# Patient Record
Sex: Female | Born: 1970 | Race: White | Hispanic: No | Marital: Married | Smoking: Never smoker
Health system: Southern US, Community
[De-identification: ages and names within clinical notes are randomized; demographics above are authoritative.]

## PROBLEM LIST (undated history)

## (undated) DIAGNOSIS — I10 Essential (primary) hypertension: Secondary | ICD-10-CM

## (undated) DIAGNOSIS — E785 Hyperlipidemia, unspecified: Secondary | ICD-10-CM

## (undated) HISTORY — DX: Hyperlipidemia, unspecified: E78.5

## (undated) HISTORY — DX: Essential (primary) hypertension: I10

---

## 2011-03-19 ENCOUNTER — Ambulatory Visit: Payer: Self-pay | Admitting: Obstetrics & Gynecology

## 2011-03-20 ENCOUNTER — Inpatient Hospital Stay: Payer: Self-pay | Admitting: Obstetrics & Gynecology

## 2012-08-05 ENCOUNTER — Ambulatory Visit: Payer: Self-pay | Admitting: Obstetrics & Gynecology

## 2015-12-14 ENCOUNTER — Other Ambulatory Visit: Payer: Self-pay | Admitting: Obstetrics & Gynecology

## 2017-01-29 ENCOUNTER — Other Ambulatory Visit: Payer: Self-pay | Admitting: Obstetrics & Gynecology

## 2017-03-04 ENCOUNTER — Ambulatory Visit: Payer: Self-pay | Admitting: Obstetrics & Gynecology

## 2017-03-15 ENCOUNTER — Other Ambulatory Visit: Payer: Self-pay | Admitting: Obstetrics & Gynecology

## 2017-03-21 ENCOUNTER — Telehealth: Payer: Self-pay

## 2017-03-21 MED ORDER — LEVONORG-ETH ESTRAD TRIPHASIC PO TABS: 1.0000 | ORAL_TABLET | Freq: Every day | ORAL | 0 refills | Status: DC

## 2017-03-21 NOTE — Telephone Encounter (Signed)
Pt has appt 9/10, needs refill of enpresse until then.  Refills eRx'd.  Pt's mailbox is full.

## 2017-04-01 ENCOUNTER — Ambulatory Visit (INDEPENDENT_AMBULATORY_CARE_PROVIDER_SITE_OTHER): Payer: BC Managed Care – PPO | Admitting: Obstetrics & Gynecology

## 2017-04-01 ENCOUNTER — Encounter: Payer: Self-pay | Admitting: Obstetrics & Gynecology

## 2017-04-01 VITALS — BP 130/90 | HR 76 | Ht 62.0 in | Wt 181.0 lb

## 2017-04-01 DIAGNOSIS — Z1231 Encounter for screening mammogram for malignant neoplasm of breast: Secondary | ICD-10-CM

## 2017-04-01 DIAGNOSIS — Z0000 Encounter for general adult medical examination without abnormal findings: Secondary | ICD-10-CM

## 2017-04-01 DIAGNOSIS — Z01419 Encounter for gynecological examination (general) (routine) without abnormal findings: Secondary | ICD-10-CM

## 2017-04-01 DIAGNOSIS — Z1239 Encounter for other screening for malignant neoplasm of breast: Secondary | ICD-10-CM

## 2017-04-01 MED ORDER — LEVONORG-ETH ESTRAD TRIPHASIC PO TABS: 1.0000 | ORAL_TABLET | Freq: Every day | ORAL | 11 refills | Status: DC

## 2017-04-01 MED ORDER — FLUOXETINE HCL 40 MG PO CAPS: 40.0000 mg | ORAL_CAPSULE | Freq: Every day | ORAL | 11 refills | Status: DC

## 2017-04-01 NOTE — Patient Instructions (Signed)
PAP every three years Mammogram every year    Call [phone] to schedule at [facility] Colonoscopy every 10 years after age 46 Labs yearly (with PCP) 

## 2017-04-01 NOTE — Progress Notes (Signed)
HPI:      [name] is a 46 y.o. [id number] who LMP was Patient's last menstrual period was 03/13/2017., she presents today for her annual examination. The patient has no complaints today. The patient is sexually active. Her last pap: approximate date 2017 and was normal and last mammogram: approximate date 2016 and was normal. The patient does perform self breast exams.  There is no notable family history of breast or ovarian cancer in her family.  The patient has regular exercise: yes.  The patient denies current symptoms of depression.    GYN History: Contraception: OCP (estrogen/progesterone)  PMHx: History reviewed. No pertinent past medical history. History reviewed. No pertinent surgical history. Family History  Problem Relation Age of Onset  . Diabetes Maternal Uncle   . Colon cancer Maternal Grandfather    Social History  Substance Use Topics  . Smoking status: Never Smoker  . Smokeless tobacco: Never Used  . Alcohol use No    Current Outpatient Prescriptions:  .  FLUoxetine (PROZAC) 40 MG capsule, Take 1 capsule (40 mg total) by mouth daily., Disp: 30 capsule, Rfl: 11 .  levonorgestrel-ethinyl estradiol (ENPRESSE,TRIVORA) tablet, Take 1 tablet by mouth daily., Disp: 1 Package, Rfl: 11 Allergies: Patient has no known allergies.  Review of Systems  Constitutional: Negative for chills, fever and malaise/fatigue.  HENT: Negative for congestion, sinus pain and sore throat.   Eyes: Negative for blurred vision and pain.  Respiratory: Negative for cough and wheezing.   Cardiovascular: Negative for chest pain and leg swelling.  Gastrointestinal: Negative for abdominal pain, constipation, diarrhea, heartburn, nausea and vomiting.  Genitourinary: Negative for dysuria, frequency, hematuria and urgency.  Musculoskeletal: Negative for back pain, joint pain, myalgias and neck pain.  Skin: Negative for itching and rash.  Neurological: Negative for dizziness, tremors and  weakness.  Endo/Heme/Allergies: Does not bruise/bleed easily.  Psychiatric/Behavioral: Negative for depression. The patient is not nervous/anxious and does not have insomnia.     Objective: BP 130/90   Pulse 76   Ht 5\' 2"  (1.575 m)   Wt 181 lb (82.1 kg)   LMP 03/13/2017   BMI 33.11 kg/m   Filed Weights   04/01/17 1545  Weight: 181 lb (82.1 kg)   Body mass index is 33.11 kg/m. Physical Exam  Constitutional: She is oriented to person, place, and time. She appears well-developed and well-nourished. No distress.  Genitourinary: Rectum normal, vagina normal and uterus normal. Pelvic exam was performed with patient supine. There is no rash or lesion on the right labia. There is no rash or lesion on the left labia. Vagina exhibits no lesion. No bleeding in the vagina. Right adnexum does not display mass and does not display tenderness. Left adnexum does not display mass and does not display tenderness. Cervix does not exhibit motion tenderness, lesion, friability or polyp.   Uterus is mobile and midaxial. Uterus is not enlarged or exhibiting a mass.  HENT:  Head: Normocephalic and atraumatic. Head is without laceration.  Right Ear: Hearing normal.  Left Ear: Hearing normal.  Nose: No epistaxis.  No foreign bodies.  Mouth/Throat: Uvula is midline, oropharynx is clear and moist and mucous membranes are normal.  Eyes: Pupils are equal, round, and reactive to light.  Neck: Normal range of motion. Neck supple. No thyromegaly present.  Cardiovascular: Normal rate and regular rhythm.  Exam reveals no gallop and no friction rub.   No murmur heard. Pulmonary/Chest: Effort normal and breath sounds normal. No respiratory distress. She  has no wheezes. Right breast exhibits no mass, no skin change and no tenderness. Left breast exhibits no mass, no skin change and no tenderness.  Abdominal: Soft. Bowel sounds are normal. She exhibits no distension. There is no tenderness. There is no rebound.    Musculoskeletal: Normal range of motion.  Neurological: She is alert and oriented to person, place, and time. No cranial nerve deficit.  Skin: Skin is warm and dry.  Psychiatric: She has a normal mood and affect. Judgment normal.  Vitals reviewed.   Assessment:  ANNUAL EXAM 1. Annual physical exam   2. Screening for breast cancer      Screening Plan:            1.  Cervical Screening-  Pap smear schedule reviewed with patient  2. Breast screening- Exam annually and mammogram>40 planned   3. Colonoscopy every 10 years, Hemoccult testing - after age 53  4. Labs UTD and were done here last year- normal  5. Counseling for contraception: oral contraceptives (estrogen/progesterone)  Other:  1. Annual physical exam  2. Screening for breast cancer - MM DIGITAL SCREENING BILATERAL; Future      F/U  Return in about 1 year (around 04/01/2018) for Annual.  Barnett Applebaum, MD, [facility], [facility] 04/01/2017  4:19 PM

## 2018-03-20 ENCOUNTER — Other Ambulatory Visit: Payer: Self-pay | Admitting: Obstetrics & Gynecology

## 2018-03-21 NOTE — Telephone Encounter (Signed)
Sch annual.  Rx renewed til then.

## 2018-03-21 NOTE — Telephone Encounter (Signed)
Patient is schedule 04/09/18 with [facility]

## 2018-04-09 ENCOUNTER — Ambulatory Visit: Payer: BC Managed Care – PPO | Admitting: Obstetrics & Gynecology

## 2018-04-10 ENCOUNTER — Ambulatory Visit: Payer: BC Managed Care – PPO | Admitting: Obstetrics & Gynecology

## 2018-04-19 ENCOUNTER — Other Ambulatory Visit: Payer: Self-pay | Admitting: Obstetrics & Gynecology

## 2018-04-24 ENCOUNTER — Ambulatory Visit (INDEPENDENT_AMBULATORY_CARE_PROVIDER_SITE_OTHER): Payer: BC Managed Care – PPO | Admitting: Obstetrics & Gynecology

## 2018-04-24 ENCOUNTER — Other Ambulatory Visit: Payer: Self-pay | Admitting: Obstetrics & Gynecology

## 2018-04-24 ENCOUNTER — Other Ambulatory Visit (HOSPITAL_COMMUNITY)
Admission: RE | Admit: 2018-04-24 | Discharge: 2018-04-24 | Disposition: A | Discharge status: Home / Self Care | Payer: BC Managed Care – PPO | Source: Ambulatory Visit | Attending: Obstetrics & Gynecology | Admitting: Obstetrics & Gynecology

## 2018-04-24 ENCOUNTER — Encounter: Payer: Self-pay | Admitting: Obstetrics & Gynecology

## 2018-04-24 VITALS — BP 140/80 | Ht 62.0 in | Wt 165.0 lb

## 2018-04-24 DIAGNOSIS — Z13220 Encounter for screening for lipoid disorders: Secondary | ICD-10-CM

## 2018-04-24 DIAGNOSIS — Z1239 Encounter for other screening for malignant neoplasm of breast: Secondary | ICD-10-CM | POA: Diagnosis not present

## 2018-04-24 DIAGNOSIS — Z1329 Encounter for screening for other suspected endocrine disorder: Secondary | ICD-10-CM | POA: Diagnosis not present

## 2018-04-24 DIAGNOSIS — Z01419 Encounter for gynecological examination (general) (routine) without abnormal findings: Secondary | ICD-10-CM

## 2018-04-24 DIAGNOSIS — Z124 Encounter for screening for malignant neoplasm of cervix: Secondary | ICD-10-CM

## 2018-04-24 DIAGNOSIS — Z0000 Encounter for general adult medical examination without abnormal findings: Secondary | ICD-10-CM

## 2018-04-24 DIAGNOSIS — Z131 Encounter for screening for diabetes mellitus: Secondary | ICD-10-CM

## 2018-04-24 DIAGNOSIS — Z1321 Encounter for screening for nutritional disorder: Secondary | ICD-10-CM

## 2018-04-24 MED ORDER — LEVONORG-ETH ESTRAD TRIPHASIC PO TABS: 1.0000 | ORAL_TABLET | Freq: Every day | ORAL | 3 refills | Status: DC

## 2018-04-24 MED ORDER — FLUOXETINE HCL 40 MG PO CAPS: 40.0000 mg | ORAL_CAPSULE | Freq: Every day | ORAL | 3 refills | Status: DC

## 2018-04-24 NOTE — Progress Notes (Signed)
HPI:      [name] is a 47 y.o. [id number] who LMP was Patient's last menstrual period was 04/09/2018., she presents today for her annual examination. The patient has no complaints today. The patient is sexually active. Her last pap: approximate date 2017 and was normal and last mammogram: approximate date 2014 and was normal. The patient does perform self breast exams.  There is no notable family history of breast or ovarian cancer in her family.  The patient has regular exercise: yes.  The patient denies current symptoms of depression.    GYN History: Contraception: OCP (estrogen/progesterone)  PMHx: History reviewed. No pertinent past medical history. History reviewed. No pertinent surgical history. Family History  Problem Relation Age of Onset  . Diabetes Maternal Uncle   . Colon cancer Maternal Grandfather   . Cancer Paternal Grandfather    Social History   Tobacco Use  . Smoking status: Never Smoker  . Smokeless tobacco: Never Used  Substance Use Topics  . Alcohol use: No  . Drug use: No    Current Outpatient Medications:  .  FLUoxetine (PROZAC) 40 MG capsule, Take 1 capsule (40 mg total) by mouth daily., Disp: 90 capsule, Rfl: 3 .  levonorgestrel-ethinyl estradiol (ENPRESSE-28) tablet, Take 1 tablet by mouth daily., Disp: 84 tablet, Rfl: 3 Allergies: Patient has no known allergies.  Review of Systems  Constitutional: Negative for chills, fever and malaise/fatigue.  HENT: Negative for congestion, sinus pain and sore throat.   Eyes: Negative for blurred vision and pain.  Respiratory: Negative for cough and wheezing.   Cardiovascular: Negative for chest pain and leg swelling.  Gastrointestinal: Negative for abdominal pain, constipation, diarrhea, heartburn, nausea and vomiting.  Genitourinary: Negative for dysuria, frequency, hematuria and urgency.  Musculoskeletal: Negative for back pain, joint pain, myalgias and neck pain.  Skin: Negative for itching and  rash.  Neurological: Negative for dizziness, tremors and weakness.  Endo/Heme/Allergies: Does not bruise/bleed easily.  Psychiatric/Behavioral: Negative for depression. The patient is not nervous/anxious and does not have insomnia.    Objective: BP 140/80   Ht 5\' 2"  (1.575 m)   Wt 165 lb (74.8 kg)   LMP 04/09/2018   BMI 30.18 kg/m   Filed Weights   04/24/18 1537  Weight: 165 lb (74.8 kg)   Body mass index is 30.18 kg/m. Physical Exam  Constitutional: She is oriented to person, place, and time. She appears well-developed and well-nourished. No distress.  Genitourinary: Rectum normal, vagina normal and uterus normal. Pelvic exam was performed with patient supine. There is no rash or lesion on the right labia. There is no rash or lesion on the left labia. Vagina exhibits no lesion. No bleeding in the vagina. Right adnexum does not display mass and does not display tenderness. Left adnexum does not display mass and does not display tenderness. Cervix does not exhibit motion tenderness, lesion, friability or polyp.   Uterus is mobile and midaxial. Uterus is not enlarged or exhibiting a mass.  HENT:  Head: Normocephalic and atraumatic. Head is without laceration.  Right Ear: Hearing normal.  Left Ear: Hearing normal.  Nose: No epistaxis.  No foreign bodies.  Mouth/Throat: Uvula is midline, oropharynx is clear and moist and mucous membranes are normal.  Eyes: Pupils are equal, round, and reactive to light.  Neck: Normal range of motion. Neck supple. No thyromegaly present.  Cardiovascular: Normal rate and regular rhythm. Exam reveals no gallop and no friction rub.  No murmur heard. Pulmonary/Chest: Effort normal  and breath sounds normal. No respiratory distress. She has no wheezes. Right breast exhibits no mass, no skin change and no tenderness. Left breast exhibits no mass, no skin change and no tenderness.  Abdominal: Soft. Bowel sounds are normal. She exhibits no distension. There is no  tenderness. There is no rebound.  Musculoskeletal: Normal range of motion.  Neurological: She is alert and oriented to person, place, and time. No cranial nerve deficit.  Skin: Skin is warm and dry.  Psychiatric: She has a normal mood and affect. Judgment normal.  Vitals reviewed.  Assessment:  ANNUAL EXAM 1. Annual physical exam   2. Screening for breast cancer   3. Screening for cervical cancer    Screening Plan:            1.  Cervical Screening-  Pap smear done today  2. Breast screening- Exam annually and mammogram>40 planned   3. Colonoscopy every 10 years, Hemoccult testing - after age 87  4. Labs To return fasting at a later date  5. Counseling for contraception: oral contraceptives (estrogen/progesterone)   6. Refill Prozac; doing well, no SE    F/U  Return in about 1 year (around 04/25/2019) for Annual.  Barnett Applebaum, MD, [facility], [facility] 04/24/2018  4:13 PM

## 2018-04-24 NOTE — Patient Instructions (Addendum)
PAP every three years Mammogram every year    Call [phone] to schedule at [facility] Colonoscopy every 10 years after age 47 Labs yearly

## 2018-04-28 LAB — CYTOLOGY - PAP
DIAGNOSIS: NEGATIVE
HPV: NOT DETECTED

## 2018-08-05 ENCOUNTER — Telehealth: Payer: Self-pay

## 2018-08-05 NOTE — Telephone Encounter (Signed)
Pt aware to schedule mammo.

## 2018-08-05 NOTE — Telephone Encounter (Signed)
-----   Message from Gae Dry, MD sent at 07/30/2018  7:57 AM EST ----- Regarding: MMG Received notice she has not received MMG yet as ordered at her Annual. Please check and encourage her to do this, and document conversation.

## 2018-12-12 ENCOUNTER — Telehealth: Payer: Self-pay

## 2018-12-12 NOTE — Telephone Encounter (Signed)
Pt calling for elevated BP and H/As.  BP has been running around 178/85 and this am it was 184/80.  Does she need to get started on medication?  What to do?  [phone]  Pt aware PH not in office today.  Adv to go to an urgent care.  Pt states she has a primary care.  She doesn't see PCP much she just sees PH.  Adv we are not a PCP.  BP is out of our scope of practice unless pregnant.  Pt states will contact PCP and see if they can see her.  Adv if they couldn't to go to an urgent care.

## 2019-02-28 ENCOUNTER — Other Ambulatory Visit: Payer: Self-pay | Admitting: Obstetrics & Gynecology

## 2019-03-17 ENCOUNTER — Other Ambulatory Visit: Payer: Self-pay

## 2019-03-17 ENCOUNTER — Telehealth: Payer: Self-pay

## 2019-03-17 NOTE — Telephone Encounter (Signed)
Pt called asking for [facility] to refill her blood pressure medication she received at the ER because she does not have a PCP. I have advised pt that she will need to have this monitored by a PCP but I will send this message to [facility] to see if he will refill in the mean time. Please advise, Thank you

## 2019-03-18 NOTE — Telephone Encounter (Signed)
Patient is schedule Monday, 9/31/20 with [place]

## 2019-03-18 NOTE — Telephone Encounter (Signed)
Can you schedule appt with [facility]

## 2019-03-18 NOTE — Telephone Encounter (Signed)
Pt does not have MyChart to message her.  I would need to see her as well as information/copies from her ER visit.  As she is on OCPs, this is a concern that she also may have HTN. If she needs help, we can assist with finding a PCP as well.  In the mean time, I should probably see her soon to recheck BP and discuss.

## 2019-03-23 ENCOUNTER — Ambulatory Visit (INDEPENDENT_AMBULATORY_CARE_PROVIDER_SITE_OTHER): Payer: BC Managed Care – PPO | Admitting: Obstetrics & Gynecology

## 2019-03-23 ENCOUNTER — Encounter: Payer: Self-pay | Admitting: Obstetrics & Gynecology

## 2019-03-23 ENCOUNTER — Other Ambulatory Visit: Payer: Self-pay

## 2019-03-23 VITALS — BP 160/90 | Ht 62.0 in | Wt 172.0 lb

## 2019-03-23 DIAGNOSIS — I10 Essential (primary) hypertension: Secondary | ICD-10-CM | POA: Insufficient documentation

## 2019-03-23 DIAGNOSIS — Z3041 Encounter for surveillance of contraceptive pills: Secondary | ICD-10-CM

## 2019-03-23 MED ORDER — AMLODIPINE BESYLATE 5 MG PO TABS: 5.0000 mg | ORAL_TABLET | Freq: Every day | ORAL | 1 refills | Status: DC

## 2019-03-23 MED ORDER — NORETHINDRONE 0.35 MG PO TABS: 1.0000 | ORAL_TABLET | Freq: Every day | ORAL | 3 refills | Status: DC

## 2019-03-23 NOTE — Progress Notes (Signed)
Obstetrics & Gynecology Office Visit   Chief Complaint  Patient presents with  . Blood Pressure Check    History of Present Illness: 48 y.o. [id number] being seen for follow up blood pressure check today.  The patient is premenopausal.  The established diagnosis for the patient is chronic hypertension.  She is currently on Amlodipine, but has been off for the last 5 dyas due to Rx running out.  SHe was started on this medicine in May when she was dx at urgent care center w HTN, noting sx's of headaches and heart palpitations prior to this dx.  She has not had these sx's any more, and not recently either off of the medicine..  She reports no current symptoms attributable to her blood pressure.  Medication list reviewed medications which may contribute to BP elevation were not noted.  Past Medical History:  History reviewed. No pertinent past medical history.  Past Surgical History:  History reviewed. No pertinent surgical history.  Gynecologic History: No LMP recorded.  Obstetric History: [id number]  Family History:  Family History  Problem Relation Age of Onset  . Diabetes Maternal Uncle   . Colon cancer Maternal Grandfather   . Cancer Paternal Grandfather     Social History:  Social History   Socioeconomic History  . Marital status: Married    Spouse name: Not on file  . Number of children: Not on file  . Years of education: Not on file  . Highest education level: Not on file  Occupational History  . Not on file  Social Needs  . Financial resource strain: Not on file  . Food insecurity    Worry: Not on file    Inability: Not on file  . Transportation needs    Medical: Not on file    Non-medical: Not on file  Tobacco Use  . Smoking status: Never Smoker  . Smokeless tobacco: Never Used  Substance and Sexual Activity  . Alcohol use: No  . Drug use: No  . Sexual activity: Yes    Birth control/protection: Pill  Lifestyle  . Physical activity    Days per week: Not on  file    Minutes per session: Not on file  . Stress: Not on file  Relationships  . Social Herbalist on phone: Not on file    Gets together: Not on file    Attends religious service: Not on file    Active member of club or organization: Not on file    Attends meetings of clubs or organizations: Not on file    Relationship status: Not on file  . Intimate partner violence    Fear of current or ex partner: Not on file    Emotionally abused: Not on file    Physically abused: Not on file    Forced sexual activity: Not on file  Other Topics Concern  . Not on file  Social History Narrative  . Not on file    Allergies:  No Known Allergies  Medications: Prior to Admission medications   Medication Sig Start Date End Date Taking? Authorizing Provider  albuterol (VENTOLIN HFA) 108 (90 Base) MCG/ACT inhaler Inhale into the lungs. 06/13/15  Yes [provider]  FLUoxetine (PROZAC) 40 MG capsule Take 1 capsule (40 mg total) by mouth daily. 04/24/18  Yes Gae Dry, MD  amLODipine (NORVASC) 5 MG tablet Take 1 tablet (5 mg total) by mouth daily. 03/23/19   Gae Dry, MD  fluticasone Asencion Islam)  50 MCG/ACT nasal spray Place into the nose. 08/23/16   [provider]  norethindrone (MICRONOR) 0.35 MG tablet Take 1 tablet (0.35 mg total) by mouth daily. 03/23/19   Gae Dry, MD    ROS  Physical Exam Blood pressure (!) 160/90, height 5\' 2"  (1.575 m), weight 172 lb (78 kg).  No LMP recorded.  General: NAD HEENT: normocephalic, anicteric Pulmonary: No increased work of breathing Cardiovascular: RRR, distal pulses 2+ Extremities: noedema, no erythema, no tenderness Neurologic: Grossly intact Psychiatric: mood appropriate, affect full  Assessment: 48 y.o. [id number] presenting for blood pressure evaluation today  Plan: Problem List Items Addressed This Visit      Cardiovascular and Mediastinum   Essential hypertension - Primary   Relevant  Medications   amLODipine (NORVASC) 5 MG tablet    Other Visit Diagnoses    Encounter for surveillance of contraceptive pills       Relevant Medications   norethindrone (MICRONOR) 0.35 MG tablet      1) Blood pressure - blood pressure at today's visit is elevated.  As a result "antihypertensive therapy will be initiated. - Restart Amlodipine 5 mg - Reassess BP this week; will call results to me - additional blood work was not obtained   2) Plan to change OCP to Progesterone Only Pill to lessen risk associated w HTN.  List of PCP in area given, and she is to schedule appt w PCP to further manage HTN long term.  Barnett Applebaum, MD, [facility], [facility] 03/23/2019  4:21 PM

## 2019-03-23 NOTE — Patient Instructions (Signed)
Primary Care in the area  This is not meant to be comprehensive list, but a resource for some providers that you can call for counseling or medication needs. If you have a recommendation, please let us know.   [facility]:  [phone]               Dr Richard Gilbert               Dr Don Fisher               Dr Angela Bacigalupo  [facility]:  [phone]  Dr Krichna Sowles       [facility], [place]:   [phone]  Spencer Copland, MD  Jessica Cody, MD  Javier Gutierrez, MD  Amy E. Bedsole, MD  [facility], [place]: [phone]  Tracy McLean-Scocuzza, MD   

## 2019-04-22 ENCOUNTER — Other Ambulatory Visit: Payer: Self-pay | Admitting: Obstetrics & Gynecology

## 2019-04-22 DIAGNOSIS — I10 Essential (primary) hypertension: Secondary | ICD-10-CM

## 2019-05-01 ENCOUNTER — Ambulatory Visit (INDEPENDENT_AMBULATORY_CARE_PROVIDER_SITE_OTHER): Payer: BC Managed Care – PPO | Admitting: Obstetrics & Gynecology

## 2019-05-01 ENCOUNTER — Encounter: Payer: Self-pay | Admitting: Obstetrics & Gynecology

## 2019-05-01 ENCOUNTER — Ambulatory Visit: Payer: BC Managed Care – PPO | Admitting: Obstetrics & Gynecology

## 2019-05-01 ENCOUNTER — Other Ambulatory Visit: Payer: Self-pay

## 2019-05-01 VITALS — BP 144/82 | Ht 62.0 in | Wt 172.0 lb

## 2019-05-01 DIAGNOSIS — Z01419 Encounter for gynecological examination (general) (routine) without abnormal findings: Secondary | ICD-10-CM

## 2019-05-01 DIAGNOSIS — I10 Essential (primary) hypertension: Secondary | ICD-10-CM

## 2019-05-01 DIAGNOSIS — Z124 Encounter for screening for malignant neoplasm of cervix: Secondary | ICD-10-CM

## 2019-05-01 DIAGNOSIS — Z1231 Encounter for screening mammogram for malignant neoplasm of breast: Secondary | ICD-10-CM

## 2019-05-01 DIAGNOSIS — N3281 Overactive bladder: Secondary | ICD-10-CM

## 2019-05-01 MED ORDER — TOLTERODINE TARTRATE ER 4 MG PO CP24: 4.0000 mg | ORAL_CAPSULE | Freq: Every day | ORAL | 11 refills | Status: DC

## 2019-05-01 MED ORDER — AMLODIPINE BESYLATE 5 MG PO TABS: 5.0000 mg | ORAL_TABLET | Freq: Every day | ORAL | 1 refills | Status: DC

## 2019-05-01 NOTE — Progress Notes (Signed)
HPI:      [name] is a 48 y.o. [phone] who LMP was Patient's last menstrual period was 04/16/2019 (exact date)., she presents today for her annual examination. The patient has no complaints today. The patient is sexually active. Her last pap: approximate date 2019 and was normal and last mammogram: many years ago. The patient does perform self breast exams.  There is no notable family history of breast or ovarian cancer in her family.  The patient has regular exercise: yes.  The patient denies current symptoms of depression.    GYN History: Contraception: oral progesterone-only contraceptive  PMHx: History reviewed. No pertinent past medical history. History reviewed. No pertinent surgical history. Family History  Problem Relation Age of Onset  . Diabetes Maternal Uncle   . Colon cancer Maternal Grandfather   . Cancer Paternal Grandfather    Social History   Tobacco Use  . Smoking status: Never Smoker  . Smokeless tobacco: Never Used  Substance Use Topics  . Alcohol use: No  . Drug use: No    Current Outpatient Medications:  .  albuterol (VENTOLIN HFA) 108 (90 Base) MCG/ACT inhaler, Inhale into the lungs., Disp: , Rfl:  .  amLODipine (NORVASC) 5 MG tablet, Take 1 tablet (5 mg total) by mouth daily., Disp: 30 tablet, Rfl: 1 .  FLUoxetine (PROZAC) 40 MG capsule, Take 1 capsule (40 mg total) by mouth daily., Disp: 90 capsule, Rfl: 3 .  norethindrone (MICRONOR) 0.35 MG tablet, Take 1 tablet (0.35 mg total) by mouth daily., Disp: 3 Package, Rfl: 3 .  fluticasone (FLONASE) 50 MCG/ACT nasal spray, Place into the nose., Disp: , Rfl:  .  tolterodine (DETROL LA) 4 MG 24 hr capsule, Take 1 capsule (4 mg total) by mouth daily., Disp: 30 capsule, Rfl: 11 Allergies: Patient has no known allergies.  Review of Systems  Constitutional: Negative for chills, fever and malaise/fatigue.  HENT: Negative for congestion, sinus pain and sore throat.   Eyes: Negative for blurred vision  and pain.  Respiratory: Negative for cough and wheezing.   Cardiovascular: Negative for chest pain and leg swelling.  Gastrointestinal: Negative for abdominal pain, constipation, diarrhea, heartburn, nausea and vomiting.  Genitourinary: Negative for dysuria, frequency, hematuria and urgency.  Musculoskeletal: Negative for back pain, joint pain, myalgias and neck pain.  Skin: Negative for itching and rash.  Neurological: Negative for dizziness, tremors and weakness.  Endo/Heme/Allergies: Does not bruise/bleed easily.  Psychiatric/Behavioral: Negative for depression. The patient is not nervous/anxious and does not have insomnia.     Objective: BP (!) 144/82   Ht 5\' 2"  (1.575 m)   Wt 172 lb (78 kg)   LMP 04/16/2019 (Exact Date)   BMI 31.46 kg/m   Filed Weights   05/01/19 1550  Weight: 172 lb (78 kg)   Body mass index is 31.46 kg/m. Physical Exam Constitutional:      General: She is not in acute distress.    Appearance: She is well-developed.  Genitourinary:     Pelvic exam was performed with patient supine.     Vagina, uterus and rectum normal.     No lesions in the vagina.     No vaginal bleeding.     No cervical motion tenderness, friability, lesion or polyp.     Uterus is mobile.     Uterus is not enlarged.     No uterine mass detected.    Uterus is midaxial.     No right or left adnexal mass  present.     Right adnexa not tender.     Left adnexa not tender.  HENT:     Head: Normocephalic and atraumatic. No laceration.     Right Ear: Hearing normal.     Left Ear: Hearing normal.     Mouth/Throat:     Pharynx: Uvula midline.  Eyes:     Pupils: Pupils are equal, round, and reactive to light.  Neck:     Musculoskeletal: Normal range of motion and neck supple.     Thyroid: No thyromegaly.  Cardiovascular:     Rate and Rhythm: Normal rate and regular rhythm.     Heart sounds: No murmur. No friction rub. No gallop.   Pulmonary:     Effort: Pulmonary effort is normal.  No respiratory distress.     Breath sounds: Normal breath sounds. No wheezing.  Chest:     Breasts:        Right: No mass, skin change or tenderness.        Left: No mass, skin change or tenderness.  Abdominal:     General: Bowel sounds are normal. There is no distension.     Palpations: Abdomen is soft.     Tenderness: There is no abdominal tenderness. There is no rebound.  Musculoskeletal: Normal range of motion.  Neurological:     Mental Status: She is alert and oriented to person, place, and time.     Cranial Nerves: No cranial nerve deficit.  Skin:    General: Skin is warm and dry.  Psychiatric:        Judgment: Judgment normal.  Vitals signs reviewed.     Assessment:  ANNUAL EXAM 1. Women's annual routine gynecological examination   2. Screening for cervical cancer   3. Encounter for screening mammogram for malignant neoplasm of breast   4. Essential hypertension   5. Overactive bladder      Screening Plan:            1.  Cervical Screening-  Pap smear schedule reviewed with patient  2. Breast screening- Exam annually and mammogram>40 planned - overdue, needs to do soon  3. Colonoscopy every 10 years, Hemoccult testing - after age 69  4. Labs managed by PCP  5. Counseling for contraception: oral progesterone-only contraceptive   6. Essential hypertension - amLODipine (NORVASC) 5 MG tablet; Take 1 tablet (5 mg total) by mouth daily.  Dispense: 30 tablet; Refill: 1 - to schedule appt w PCP for long term mgt of HTN encouarged, a must to do; she voices understanding   7. Overactive bladder - tolterodine (DETROL LA) 4 MG 24 hr capsule; Take 1 capsule (4 mg total) by mouth daily.  Dispense: 30 capsule; Refill: 11 - trial of meds; risks and side effects discussed     F/U  Return in about 1 year (around 04/30/2020) for Annual.  Barnett Applebaum, MD, [facility], [facility] 05/01/2019  4:20 PM

## 2019-05-01 NOTE — Patient Instructions (Addendum)
PAP every three years Mammogram every year    Call [phone] to schedule at [facility] (Stephanie Ferrell) Colonoscopy every 10 years after age 48 Labs yearly (with PCP)  Tolterodine extended-release capsules What is this medicine? TOLTERODINE (tole TER a deen) is used to treat overactive bladder. This medicine reduces the amount of bathroom visits. It may also help to control wetting accidents. This medicine may be used for other purposes; ask your health care provider or pharmacist if you have questions. COMMON BRAND NAME(S): Detrol LA What should I tell my health care provider before I take this medicine? They need to know if you have any of these conditions:  difficulty passing urine  glaucoma  intestinal obstruction  irregular heartbeat or you have a family member with irregular heartbeat  kidney disease  liver disease  myasthenia gravis  an unusual or allergic reaction to tolterodine, fesoterodine, other medicines, foods, dyes, or preservatives  pregnant or trying to get pregnant  breast-feeding How should I use this medicine? Take this medicine by mouth with a glass of water. Swallow whole, do not crush, cut, or chew. Follow the directions on the prescription label. Take your doses at regular intervals. Do not take your medicine more often than directed. Talk to your pediatrician regarding the use of this medicine in children. Special care may be needed. Overdosage: If you think you have taken too much of this medicine contact a poison control center or emergency room at once. NOTE: This medicine is only for you. Do not share this medicine with others. What if I miss a dose? If you miss a dose, take it as soon as you can. If it is almost time for your next dose, take only that dose. Do not take double or extra doses. What may interact with this medicine?  clarithromycin  cyclosporine  erythromycin  fluoxetine  medicines for fungal infections, like fluconazole, itraconazole,  ketoconazole or voriconazole  medicines for memory problems like galantamine, donepezil, tacrine  vinblastine This list may not describe all possible interactions. Give your health care provider a list of all the medicines, herbs, non-prescription drugs, or dietary supplements you use. Also tell them if you smoke, drink alcohol, or use illegal drugs. Some items may interact with your medicine. What should I watch for while using this medicine? It may take 2 or 3 months to notice the full benefit from this medicine. Your health care professional may also recommend techniques that may help improve control of your bladder and sphincter muscles. These techniques will help you need the bathroom less frequently. You may need to limit your intake tea, coffee, caffeinated sodas, and alcohol. These drinks may make your symptoms worse. Keeping healthy bowel habits may lessen bladder symptoms. If you currently smoke, quitting smoking may help reduce irritation to the bladder muscle. You may get drowsy or dizzy. Do not drive, use machinery, or do anything that needs mental alertness until you know how this drug affects you. Do not stand or sit up quickly, especially if you are an older patient. This reduces the risk of dizzy or fainting spells. Your mouth may get dry. Chewing sugarless gum or sucking hard candy, and drinking plenty of water, will help. This medicine may cause dry eyes and blurred vision. If you wear contact lenses you may feel some discomfort. Lubricating drops may help. See your eye doctor if the problem does not go away or is severe. What side effects may I notice from receiving this medicine? Side effects that you should report  to your doctor or health care professional as soon as possible:  allergic reactions like skin rash, itching or hives, swelling of the face, lips, or tongue  breathing problems  confusion  difficulty passing urine  fast, irregular  heartbeat  hallucinations  memory problems  swelling in feet, hands Side effects that usually do not require medical attention (report to your doctor or health care professional if they continue or are bothersome):  changes in vision  constipation  dry eyes, mouth  headache  dizziness, drowsiness  stomach upset This list may not describe all possible side effects. Call your doctor for medical advice about side effects. You may report side effects to FDA at 1-800-FDA-1088. Where should I keep my medicine? Keep out of the reach of children. Store at room temperature between 15 and 30 degrees C (59 and 86 degrees F). Protect from light. Throw away any unused medicine after the expiration date. NOTE: This sheet is a summary. It may not cover all possible information. If you have questions about this medicine, talk to your doctor, pharmacist, or health care provider.  2020 Elsevier/Gold Standard (2010-04-18 17:20:26)

## 2019-05-23 ENCOUNTER — Other Ambulatory Visit: Payer: Self-pay | Admitting: Obstetrics & Gynecology

## 2019-07-02 ENCOUNTER — Ambulatory Visit: Payer: BC Managed Care – PPO | Admitting: Nurse Practitioner

## 2019-07-02 ENCOUNTER — Other Ambulatory Visit: Payer: Self-pay

## 2019-07-02 ENCOUNTER — Encounter: Payer: Self-pay | Admitting: Nurse Practitioner

## 2019-07-02 VITALS — Ht 62.0 in | Wt 171.0 lb

## 2019-07-02 DIAGNOSIS — N3281 Overactive bladder: Secondary | ICD-10-CM | POA: Diagnosis not present

## 2019-07-02 DIAGNOSIS — I10 Essential (primary) hypertension: Secondary | ICD-10-CM | POA: Diagnosis not present

## 2019-07-02 MED ORDER — AMLODIPINE BESYLATE 5 MG PO TABS: 5.0000 mg | ORAL_TABLET | Freq: Every day | ORAL | 1 refills | Status: DC

## 2019-07-02 NOTE — Progress Notes (Signed)
[facility] [place], [place] [address]  Internal MEDICINE  Telephone Visit  Patient Name: Stephanie Ferrell  [id number]  [id number]  Date of Service: 07/05/2019  I connected with the patient at 4:49pm by telephone and verified the patients identity using two identifiers.   I discussed the limitations, risks, secnd management service by telephone and security and privacy concerns of performing an evalGuation ahe vf asdgavailability of in person appointments. I also discussed with the patient that there may be a patient responsible charge related to the service.  The patient expressed understanding and agrees to proceed.    Chief Complaint  Patient presents with  . Establish Care  . Hypertension    The patient has been contacted via telephone for follow up visit due to concerns for spread of novel coronavirus. She is seeking to establish primary care provider. The patient has been basically been seeing her GYN provider for all health care needs. She does have history of high blood pressure. This is well controlled on current medication.       Current Medication: Outpatient Encounter Medications as of 07/02/2019  Medication Sig  . amLODipine (NORVASC) 5 MG tablet Take 1 tablet (5 mg total) by mouth daily.  Marland Kitchen FLUoxetine (PROZAC) 40 MG capsule TAKE 1 CAPSULE BY MOUTH EVERY DAY  . norethindrone (MICRONOR) 0.35 MG tablet Take 1 tablet (0.35 mg total) by mouth daily.  Marland Kitchen tolterodine (DETROL LA) 4 MG 24 hr capsule Take 1 capsule (4 mg total) by mouth daily.  . [DISCONTINUED] amLODipine (NORVASC) 5 MG tablet Take 1 tablet (5 mg total) by mouth daily.  . [DISCONTINUED] albuterol (VENTOLIN HFA) 108 (90 Base) MCG/ACT inhaler Inhale into the lungs.  . [DISCONTINUED] fluticasone (FLONASE) 50 MCG/ACT nasal spray Place into the nose.   No facility-administered encounter medications on file as of 07/02/2019.    Surgical History: Past Surgical History:  Procedure  Laterality Date  . CESAREAN SECTION      Medical History: Past Medical History:  Diagnosis Date  . Hypertension     Family History: Family History  Problem Relation Age of Onset  . Diabetes Maternal Uncle   . Colon cancer Maternal Grandfather   . Cancer Paternal Grandfather     Social History   Socioeconomic History  . Marital status: Married    Spouse name: Not on file  . Number of children: Not on file  . Years of education: Not on file  . Highest education level: Not on file  Occupational History  . Not on file  Tobacco Use  . Smoking status: Never Smoker  . Smokeless tobacco: Never Used  Substance and Sexual Activity  . Alcohol use: Yes    Comment: ocassionally   . Drug use: No  . Sexual activity: Yes    Birth control/protection: Pill  Other Topics Concern  . Not on file  Social History Narrative  . Not on file   Social Determinants of Health   Financial Resource Strain:   . Difficulty of Paying Living Expenses: Not on file  Food Insecurity:   . Worried About Charity fundraiser in the Last Year: Not on file  . Ran Out of Food in the Last Year: Not on file  Transportation Needs:   . Lack of Transportation (Medical): Not on file  . Lack of Transportation (Non-Medical): Not on file  Physical Activity:   . Days of Exercise per Week: Not on file  . Minutes of Exercise per Session:  Not on file  Stress:   . Feeling of Stress : Not on file  Social Connections:   . Frequency of Communication with Friends and Family: Not on file  . Frequency of Social Gatherings with Friends and Family: Not on file  . Attends Religious Services: Not on file  . Active Member of Clubs or Organizations: Not on file  . Attends Archivist Meetings: Not on file  . Marital Status: Not on file  Intimate Partner Violence:   . Fear of Current or Ex-Partner: Not on file  . Emotionally Abused: Not on file  . Physically Abused: Not on file  . Sexually Abused: Not on file       Review of Systems  Constitutional: Negative for activity change, chills, fatigue and unexpected weight change.  HENT: Negative for congestion, postnasal drip, rhinorrhea, sneezing and sore throat.   Respiratory: Negative for cough, chest tightness, shortness of breath and wheezing.   Cardiovascular: Negative for chest pain and palpitations.  Gastrointestinal: Negative for abdominal pain, constipation, diarrhea, nausea and vomiting.  Endocrine: Negative for cold intolerance, heat intolerance, polydipsia and polyuria.  Musculoskeletal: Negative for arthralgias, back pain, joint swelling and neck pain.  Skin: Negative for rash.  Allergic/Immunologic: Negative for environmental allergies.  Neurological: Negative for dizziness, tremors, numbness and headaches.  Hematological: Negative for adenopathy. Does not bruise/bleed easily.  Psychiatric/Behavioral: Negative for behavioral problems (Depression), sleep disturbance and suicidal ideas. The patient is not nervous/anxious.     Today's Vitals   07/02/19 1529  Weight: 171 lb (77.6 kg)  Height: 5\' 2"  (1.575 m)   Body mass index is 31.28 kg/m.  Observation/Objective:   The patient is alert and oriented. She is pleasant and answers all questions appropriately. Breathing is non-labored. She is in no acute distress at this time.    Assessment/Plan: 1. Essential hypertension Stable. Continue amlodipine as prescribed  - amLODipine (NORVASC) 5 MG tablet; Take 1 tablet (5 mg total) by mouth daily.  Dispense: 30 tablet; Refill: 1  2. Overactive bladder Sees her GYN provider for management.   General Counseling: Stephanie Ferrell understanding of the findings of today's phone visit and agrees with plan of treatment. I have discussed any further diagnostic evaluation that may be needed or ordered today. We also reviewed her medications today. she has been encouraged to call the office with any questions or concerns that should arise  related to todays visit.  Hypertension Counseling:   The following hypertensive lifestyle modification were recommended and discussed:  1. Limiting alcohol intake to less than 1 oz/day of ethanol:(24 oz of beer or 8 oz of wine or 2 oz of 100-proof whiskey). 2. Take baby ASA 81 mg daily. 3. Importance of regular aerobic exercise and losing weight. 4. Reduce dietary saturated fat and cholesterol intake for overall cardiovascular health. 5. Maintaining adequate dietary potassium, calcium, and magnesium intake. 6. Regular monitoring of the blood pressure. 7. Reduce sodium intake to less than 100 mmol/day (less than 2.3 gm of sodium or less than 6 gm of sodium choride)   This patient was seen by [place] with Dr Lavera Guise as a part of collaborative care agreement  Meds ordered this encounter  Medications  . amLODipine (NORVASC) 5 MG tablet    Sig: Take 1 tablet (5 mg total) by mouth daily.    Dispense:  30 tablet    Refill:  1    Order Specific Question:   Supervising Provider    Answer:  Lavera Guise [id number]    Time spent: 25 Minutes    Dr Lavera Guise Internal medicine

## 2019-07-28 ENCOUNTER — Telehealth: Payer: Self-pay

## 2019-07-28 NOTE — Telephone Encounter (Signed)
Rescheduled appointment on 01/047/2021 to 08/07/2019. klh

## 2019-07-30 ENCOUNTER — Ambulatory Visit: Payer: BC Managed Care – PPO | Admitting: Adult Health

## 2019-08-06 ENCOUNTER — Telehealth: Payer: Self-pay

## 2019-08-06 NOTE — Telephone Encounter (Signed)
-----   Message from Gae Dry, MD sent at 07/28/2019  7:39 AM EST ----- Regarding: MMG Received notice she has not received MMG yet as ordered at her Annual. Please check and encourage her to do this, and document conversation.

## 2019-08-06 NOTE — Telephone Encounter (Signed)
Pt aware to schedule mammo

## 2019-08-07 ENCOUNTER — Telehealth: Payer: Self-pay

## 2019-08-07 ENCOUNTER — Ambulatory Visit: Payer: BC Managed Care – PPO | Admitting: Adult Health

## 2019-08-07 NOTE — Telephone Encounter (Signed)
Rescheduled appointment form 08/07/2019 to 08/18/2019. klh

## 2019-08-07 NOTE — Telephone Encounter (Signed)
I just placed lab slip on Stephanie Ferrell's desk to mail out.

## 2019-08-13 ENCOUNTER — Telehealth: Payer: Self-pay

## 2019-08-13 NOTE — Telephone Encounter (Signed)
CONFIRMED AND SCREENED FOR 08-18-19 OV. IN OFFICE DUE TO BP

## 2019-08-18 ENCOUNTER — Encounter: Payer: Self-pay | Admitting: Nurse Practitioner

## 2019-08-18 ENCOUNTER — Ambulatory Visit (INDEPENDENT_AMBULATORY_CARE_PROVIDER_SITE_OTHER): Payer: BC Managed Care – PPO | Admitting: Nurse Practitioner

## 2019-08-18 DIAGNOSIS — I10 Essential (primary) hypertension: Secondary | ICD-10-CM | POA: Diagnosis not present

## 2019-08-18 MED ORDER — AMLODIPINE BESYLATE 5 MG PO TABS: 5.0000 mg | ORAL_TABLET | Freq: Every day | ORAL | 1 refills | Status: DC

## 2019-08-18 NOTE — Progress Notes (Signed)
[facility], [place] [address]  Internal MEDICINE  Office Visit Note  Patient Name: Stephanie Ferrell  [id number]  [id number]  Date of Service: 08/19/2019  Chief Complaint  Patient presents with  . Follow-up    just got lab slip in mail today   . Hypertension  . Hand Problem    hand shakes when trying to pick up things    The patient presents for follow up visit. Blood pressure is mildly elevated upon arrival to the office. It did come down, however, still mildly elevated. She denies chest pain, chest pressure, or shortness of breath. She denies headache. She is doing well with current dose amlodipine. Her GYN provider continues to prescribe her prozac. She denies new problemsor concerns.        Current Medication: Outpatient Encounter Medications as of 08/18/2019  Medication Sig  . amLODipine (NORVASC) 5 MG tablet Take 1 tablet (5 mg total) by mouth daily.  Marland Kitchen FLUoxetine (PROZAC) 40 MG capsule TAKE 1 CAPSULE BY MOUTH EVERY DAY  . norethindrone (MICRONOR) 0.35 MG tablet Take 1 tablet (0.35 mg total) by mouth daily.  . [DISCONTINUED] amLODipine (NORVASC) 5 MG tablet Take 1 tablet (5 mg total) by mouth daily.  . [DISCONTINUED] tolterodine (DETROL LA) 4 MG 24 hr capsule Take 1 capsule (4 mg total) by mouth daily. (Patient not taking: Reported on 08/18/2019)   No facility-administered encounter medications on file as of 08/18/2019.    Surgical History: Past Surgical History:  Procedure Laterality Date  . CESAREAN SECTION      Medical History: Past Medical History:  Diagnosis Date  . Hypertension     Family History: Family History  Problem Relation Age of Onset  . Diabetes Maternal Uncle   . Colon cancer Maternal Grandfather   . Cancer Paternal Grandfather   . Anxiety disorder Mother   . Depression Mother   . Hypertension Father     Social History   Socioeconomic History  . Marital status: Married    Spouse name: Not on file  .  Number of children: Not on file  . Years of education: Not on file  . Highest education level: Not on file  Occupational History  . Not on file  Tobacco Use  . Smoking status: Never Smoker  . Smokeless tobacco: Never Used  Substance and Sexual Activity  . Alcohol use: Yes    Comment: ocassionally   . Drug use: No  . Sexual activity: Yes    Birth control/protection: Pill  Other Topics Concern  . Not on file  Social History Narrative  . Not on file   Social Determinants of Health   Financial Resource Strain:   . Difficulty of Paying Living Expenses: Not on file  Food Insecurity:   . Worried About Charity fundraiser in the Last Year: Not on file  . Ran Out of Food in the Last Year: Not on file  Transportation Needs:   . Lack of Transportation (Medical): Not on file  . Lack of Transportation (Non-Medical): Not on file  Physical Activity:   . Days of Exercise per Week: Not on file  . Minutes of Exercise per Session: Not on file  Stress:   . Feeling of Stress : Not on file  Social Connections:   . Frequency of Communication with Friends and Family: Not on file  . Frequency of Social Gatherings with Friends and Family: Not on file  . Attends Religious Services: Not on  file  . Active Member of Clubs or Organizations: Not on file  . Attends Archivist Meetings: Not on file  . Marital Status: Not on file  Intimate Partner Violence:   . Fear of Current or Ex-Partner: Not on file  . Emotionally Abused: Not on file  . Physically Abused: Not on file  . Sexually Abused: Not on file      Review of Systems  Constitutional: Negative for activity change, chills, fatigue and unexpected weight change.  HENT: Negative for congestion, postnasal drip, rhinorrhea, sneezing and sore throat.   Respiratory: Negative for cough, chest tightness, shortness of breath and wheezing.   Cardiovascular: Negative for chest pain and palpitations.  Gastrointestinal: Negative for abdominal  pain, constipation, diarrhea, nausea and vomiting.  Endocrine: Negative for cold intolerance, heat intolerance, polydipsia and polyuria.  Musculoskeletal: Negative for arthralgias, back pain, joint swelling and neck pain.  Skin: Negative for rash.  Allergic/Immunologic: Negative for environmental allergies.  Neurological: Negative for dizziness, tremors, numbness and headaches.  Hematological: Negative for adenopathy. Does not bruise/bleed easily.  Psychiatric/Behavioral: Negative for behavioral problems (Depression), sleep disturbance and suicidal ideas. The patient is nervous/anxious.        Well managed on current medication.     Today's Vitals   08/18/19 1604  BP: (!) 142/80  Pulse: 75  Resp: 16  Temp: 97.8 F (36.6 C)  SpO2: 100%  Weight: 169 lb (76.7 kg)  Height: 5\' 2"  (1.575 m)   Body mass index is 30.91 kg/m.  Physical Exam Vitals and nursing note reviewed.  Constitutional:      General: She is not in acute distress.    Appearance: Normal appearance. She is well-developed. She is not diaphoretic.  HENT:     Head: Normocephalic and atraumatic.     Nose: Nose normal.     Mouth/Throat:     Pharynx: No oropharyngeal exudate.  Eyes:     Pupils: Pupils are equal, round, and reactive to light.  Neck:     Thyroid: No thyromegaly.     Vascular: No JVD.     Trachea: No tracheal deviation.  Cardiovascular:     Rate and Rhythm: Normal rate and regular rhythm.     Heart sounds: Normal heart sounds. No murmur. No friction rub. No gallop.   Pulmonary:     Effort: Pulmonary effort is normal. No respiratory distress.     Breath sounds: Normal breath sounds. No wheezing or rales.  Chest:     Chest wall: No tenderness.  Abdominal:     Palpations: Abdomen is soft.  Musculoskeletal:        General: Normal range of motion.     Cervical back: Normal range of motion and neck supple.  Lymphadenopathy:     Cervical: No cervical adenopathy.  Skin:    General: Skin is warm and  dry.  Neurological:     Mental Status: She is alert and oriented to person, place, and time.     Cranial Nerves: No cranial nerve deficit.  Psychiatric:        Behavior: Behavior normal.        Thought Content: Thought content normal.        Judgment: Judgment normal.    Assessment/Plan: 1. Essential hypertension Stable. Continue amlodipine as prescribed. Refills provided today. Patient has lab slip to obtain routine, fasting labs. Will discuss results with patient when they are available.  - amLODipine (NORVASC) 5 MG tablet; Take 1 tablet (5 mg total) by  mouth daily.  Dispense: 90 tablet; Refill: 1  General Counseling: [place] verbalizes understanding of the findings of todays visit and agrees with plan of treatment. I have discussed any further diagnostic evaluation that may be needed or ordered today. We also reviewed her medications today. she has been encouraged to call the office with any questions or concerns that should arise related to todays visit.  Hypertension Counseling:   The following hypertensive lifestyle modification were recommended and discussed:  1. Limiting alcohol intake to less than 1 oz/day of ethanol:(24 oz of beer or 8 oz of wine or 2 oz of 100-proof whiskey). 2. Take baby ASA 81 mg daily. 3. Importance of regular aerobic exercise and losing weight. 4. Reduce dietary saturated fat and cholesterol intake for overall cardiovascular health. 5. Maintaining adequate dietary potassium, calcium, and magnesium intake. 6. Regular monitoring of the blood pressure. 7. Reduce sodium intake to less than 100 mmol/day (less than 2.3 gm of sodium or less than 6 gm of sodium choride)   This patient was seen by [place] with Dr Lavera Guise as a part of collaborative care agreement  Meds ordered this encounter  Medications  . amLODipine (NORVASC) 5 MG tablet    Sig: Take 1 tablet (5 mg total) by mouth daily.    Dispense:  90 tablet    Refill:  1     Order Specific Question:   Supervising Provider    Answer:   Lavera Guise [id number]    Total time spent: 20 Minutes   Time spent includes review of chart, medications, test results, and follow up plan with the patient.      Dr Lavera Guise Internal medicine

## 2019-11-12 ENCOUNTER — Other Ambulatory Visit: Payer: Self-pay | Admitting: Obstetrics & Gynecology

## 2019-11-12 ENCOUNTER — Other Ambulatory Visit: Payer: Self-pay | Admitting: Nurse Practitioner

## 2019-11-12 DIAGNOSIS — Z1231 Encounter for screening mammogram for malignant neoplasm of breast: Secondary | ICD-10-CM

## 2019-11-16 ENCOUNTER — Ambulatory Visit
Admission: RE | Admit: 2019-11-16 | Discharge: 2019-11-16 | Disposition: A | Discharge status: Home / Self Care | Payer: BC Managed Care – PPO | Source: Ambulatory Visit | Attending: Obstetrics & Gynecology | Admitting: Obstetrics & Gynecology

## 2019-11-16 DIAGNOSIS — Z1231 Encounter for screening mammogram for malignant neoplasm of breast: Secondary | ICD-10-CM | POA: Diagnosis present

## 2019-11-24 ENCOUNTER — Other Ambulatory Visit: Payer: Self-pay | Admitting: Obstetrics & Gynecology

## 2019-12-01 ENCOUNTER — Encounter: Payer: Self-pay | Admitting: Obstetrics & Gynecology

## 2019-12-08 ENCOUNTER — Other Ambulatory Visit: Payer: Self-pay | Admitting: Obstetrics & Gynecology

## 2019-12-08 DIAGNOSIS — Z3041 Encounter for surveillance of contraceptive pills: Secondary | ICD-10-CM

## 2020-02-16 ENCOUNTER — Telehealth: Payer: Self-pay

## 2020-02-16 NOTE — Telephone Encounter (Signed)
Confirmed and screened for 02-18-20 ov. 

## 2020-02-18 ENCOUNTER — Encounter: Payer: Self-pay | Admitting: Nurse Practitioner

## 2020-02-18 ENCOUNTER — Ambulatory Visit: Payer: BC Managed Care – PPO | Admitting: Nurse Practitioner

## 2020-02-18 ENCOUNTER — Other Ambulatory Visit: Payer: Self-pay

## 2020-02-18 DIAGNOSIS — I10 Essential (primary) hypertension: Secondary | ICD-10-CM

## 2020-02-18 MED ORDER — AMLODIPINE BESYLATE 5 MG PO TABS: 5.0000 mg | ORAL_TABLET | Freq: Every day | ORAL | 1 refills | Status: DC

## 2020-02-18 NOTE — Progress Notes (Signed)
[facility], [place] [address]  Internal MEDICINE  Office Visit Note  Patient Name: Stephanie Ferrell  [id number]  [id number]  Date of Service: 03/13/2020  Chief Complaint  Patient presents with  . Follow-up  . Hypertension  . Quality Metric Gaps    TDAP    The patient is here for routine follow up. Blood pressure is mildly elevated. She takes amlodipine to control her blood pressure and her blood pressure is generally well managed. She denies chest pain, chest pressure, or shortness of breath. GYN provider prescribes her fluoxetine and OCP. She reports all symptoms are well controlled.       Current Medication: Outpatient Encounter Medications as of 02/18/2020  Medication Sig  . amLODipine (NORVASC) 5 MG tablet Take 1 tablet (5 mg total) by mouth daily.  Marland Kitchen FLUoxetine (PROZAC) 40 MG capsule TAKE 1 CAPSULE BY MOUTH EVERY DAY  . norethindrone (MICRONOR) 0.35 MG tablet TAKE 1 TABLET BY MOUTH DAILY  . [DISCONTINUED] amLODipine (NORVASC) 5 MG tablet Take 1 tablet (5 mg total) by mouth daily.   No facility-administered encounter medications on file as of 02/18/2020.    Surgical History: Past Surgical History:  Procedure Laterality Date  . CESAREAN SECTION      Medical History: Past Medical History:  Diagnosis Date  . Hypertension     Family History: Family History  Problem Relation Age of Onset  . Diabetes Maternal Uncle   . Colon cancer Maternal Grandfather   . Cancer Paternal Grandfather   . Anxiety disorder Mother   . Depression Mother   . Hypertension Father     Social History   Socioeconomic History  . Marital status: Married    Spouse name: Not on file  . Number of children: Not on file  . Years of education: Not on file  . Highest education level: Not on file  Occupational History  . Not on file  Tobacco Use  . Smoking status: Never Smoker  . Smokeless tobacco: Never Used  Vaping Use  . Vaping Use: Never used   Substance and Sexual Activity  . Alcohol use: Yes    Comment: ocassionally   . Drug use: No  . Sexual activity: Yes    Birth control/protection: Pill  Other Topics Concern  . Not on file  Social History Narrative  . Not on file   Social Determinants of Health   Financial Resource Strain:   . Difficulty of Paying Living Expenses: Not on file  Food Insecurity:   . Worried About Charity fundraiser in the Last Year: Not on file  . Ran Out of Food in the Last Year: Not on file  Transportation Needs:   . Lack of Transportation (Medical): Not on file  . Lack of Transportation (Non-Medical): Not on file  Physical Activity:   . Days of Exercise per Week: Not on file  . Minutes of Exercise per Session: Not on file  Stress:   . Feeling of Stress : Not on file  Social Connections:   . Frequency of Communication with Friends and Family: Not on file  . Frequency of Social Gatherings with Friends and Family: Not on file  . Attends Religious Services: Not on file  . Active Member of Clubs or Organizations: Not on file  . Attends Archivist Meetings: Not on file  . Marital Status: Not on file  Intimate Partner Violence:   . Fear of Current or Ex-Partner: Not on file  .  Emotionally Abused: Not on file  . Physically Abused: Not on file  . Sexually Abused: Not on file      Review of Systems  Constitutional: Negative for activity change, chills, fatigue and unexpected weight change.  HENT: Negative for congestion, postnasal drip, rhinorrhea, sneezing and sore throat.   Respiratory: Negative for cough, chest tightness, shortness of breath and wheezing.   Cardiovascular: Negative for chest pain and palpitations.  Gastrointestinal: Negative for abdominal pain, constipation, diarrhea, nausea and vomiting.  Endocrine: Negative for cold intolerance, heat intolerance, polydipsia and polyuria.  Musculoskeletal: Negative for arthralgias, back pain, joint swelling and neck pain.   Skin: Negative for rash.  Allergic/Immunologic: Negative for environmental allergies.  Neurological: Negative for dizziness, tremors, numbness and headaches.  Hematological: Negative for adenopathy. Does not bruise/bleed easily.  Psychiatric/Behavioral: Negative for behavioral problems (Depression), sleep disturbance and suicidal ideas. The patient is nervous/anxious.        Well managed on current medication.     Today's Vitals   02/18/20 1410  BP: (!) 141/87  Pulse: 82  Resp: 16  Temp: (!) 97.3 F (36.3 C)  SpO2: 95%  Weight: 173 lb 12.8 oz (78.8 kg)  Height: 5\' 2"  (1.575 m)   Body mass index is 31.79 kg/m.  Physical Exam Vitals and nursing note reviewed.  Constitutional:      General: She is not in acute distress.    Appearance: Normal appearance. She is well-developed. She is not diaphoretic.  HENT:     Head: Normocephalic and atraumatic.     Nose: Nose normal.     Mouth/Throat:     Pharynx: No oropharyngeal exudate.  Eyes:     Pupils: Pupils are equal, round, and reactive to light.  Neck:     Thyroid: No thyromegaly.     Vascular: No JVD.     Trachea: No tracheal deviation.  Cardiovascular:     Rate and Rhythm: Normal rate and regular rhythm.     Heart sounds: Normal heart sounds. No murmur heard.  No friction rub. No gallop.   Pulmonary:     Effort: Pulmonary effort is normal. No respiratory distress.     Breath sounds: Normal breath sounds. No wheezing or rales.  Chest:     Chest wall: No tenderness.  Abdominal:     Palpations: Abdomen is soft.  Musculoskeletal:        General: Normal range of motion.     Cervical back: Normal range of motion and neck supple.  Lymphadenopathy:     Cervical: No cervical adenopathy.  Skin:    General: Skin is warm and dry.  Neurological:     Mental Status: She is alert and oriented to person, place, and time.     Cranial Nerves: No cranial nerve deficit.  Psychiatric:        Behavior: Behavior normal.        Thought  Content: Thought content normal.        Judgment: Judgment normal.    Assessment/Plan: 1. Essential hypertension Stable. Continue amlodipine 5mg  daily. Refills provided today.  - amLODipine (NORVASC) 5 MG tablet; Take 1 tablet (5 mg total) by mouth daily.  Dispense: 90 tablet; Refill: 1  General Counseling: Stephanie Ferrell verbalizes understanding of the findings of todays visit and agrees with plan of treatment. I have discussed any further diagnostic evaluation that may be needed or ordered today. We also reviewed her medications today. she has been encouraged to call the office with any questions or concerns  that should arise related to todays visit.   Hypertension Counseling:   The following hypertensive lifestyle modification were recommended and discussed:  1. Limiting alcohol intake to less than 1 oz/day of ethanol:(24 oz of beer or 8 oz of wine or 2 oz of 100-proof whiskey). 2. Take baby ASA 81 mg daily. 3. Importance of regular aerobic exercise and losing weight. 4. Reduce dietary saturated fat and cholesterol intake for overall cardiovascular health. 5. Maintaining adequate dietary potassium, calcium, and magnesium intake. 6. Regular monitoring of the blood pressure. 7. Reduce sodium intake to less than 100 mmol/day (less than 2.3 gm of sodium or less than 6 gm of sodium choride)   This patient was seen by [place] with Dr Lavera Guise as a part of collaborative care agreement  Meds ordered this encounter  Medications  . amLODipine (NORVASC) 5 MG tablet    Sig: Take 1 tablet (5 mg total) by mouth daily.    Dispense:  90 tablet    Refill:  1    Order Specific Question:   Supervising Provider    Answer:   Lavera Guise [[id number]]    Total time spent: 25 Minutes   Time spent includes review of chart, medications, test results, and follow up plan with the patient.      Dr Lavera Guise Internal medicine

## 2020-03-02 ENCOUNTER — Telehealth: Payer: Self-pay | Admitting: Nurse Practitioner

## 2020-03-02 ENCOUNTER — Other Ambulatory Visit: Payer: Self-pay | Admitting: Nurse Practitioner

## 2020-03-02 DIAGNOSIS — J0140 Acute pansinusitis, unspecified: Secondary | ICD-10-CM

## 2020-03-02 MED ORDER — AZITHROMYCIN 250 MG PO TABS: ORAL_TABLET | ORAL | 0 refills | Status: DC

## 2020-03-02 MED ORDER — FLUTICASONE PROPIONATE 50 MCG/ACT NA SUSP: 2.0000 | Freq: Every day | NASAL | 6 refills | Status: AC

## 2020-03-02 NOTE — Telephone Encounter (Signed)
Sent in prescription for z-pack. Take as directed for 5 days. Also added flonase nasal spray. Use one to two sprays in both nostrils daily. [facility].

## 2020-03-02 NOTE — Telephone Encounter (Signed)
Patient called stating she had symptoms consistent with a sinus infection and is requesting she have an antibiotic sent to her pharmacy. [facility] uses [facility] is not requesting a specific antibiotic.

## 2020-03-15 ENCOUNTER — Other Ambulatory Visit: Payer: Self-pay | Admitting: Obstetrics & Gynecology

## 2020-03-17 NOTE — Telephone Encounter (Signed)
Patient is schedule for 05/04/20 with [facility]

## 2020-05-04 ENCOUNTER — Encounter: Payer: Self-pay | Admitting: Obstetrics & Gynecology

## 2020-05-04 ENCOUNTER — Other Ambulatory Visit: Payer: Self-pay

## 2020-05-04 ENCOUNTER — Ambulatory Visit (INDEPENDENT_AMBULATORY_CARE_PROVIDER_SITE_OTHER): Payer: BC Managed Care – PPO | Admitting: Obstetrics & Gynecology

## 2020-05-04 VITALS — BP 122/80 | Ht 62.0 in | Wt 174.0 lb

## 2020-05-04 DIAGNOSIS — Z3041 Encounter for surveillance of contraceptive pills: Secondary | ICD-10-CM

## 2020-05-04 DIAGNOSIS — Z1211 Encounter for screening for malignant neoplasm of colon: Secondary | ICD-10-CM | POA: Diagnosis not present

## 2020-05-04 MED ORDER — FLUOXETINE HCL 40 MG PO CAPS
ORAL_CAPSULE | ORAL | 3 refills | Status: DC
Start: 2020-05-04 — End: 2021-06-12

## 2020-05-04 MED ORDER — NORETHINDRONE 0.35 MG PO TABS: 1.0000 | ORAL_TABLET | Freq: Every day | ORAL | 3 refills | Status: DC

## 2020-05-04 MED ORDER — TOLTERODINE TARTRATE ER 4 MG PO CP24: 4.0000 mg | ORAL_CAPSULE | Freq: Every day | ORAL | 3 refills | Status: DC

## 2020-05-04 NOTE — Progress Notes (Signed)
HPI:      [name] is a 49 y.o. [phone] who LMP was Patient's last menstrual period was 04/13/2020., she presents today for her annual examination. The patient has no complaints today. Detrol helps w OAB, notices worsening sxs when skips doses for several days.  Still feels she needs Prozac for stress and anxiety esp w work still. Planning to retire in 1-2 years.  The patient is sexually active. Her last pap: approximate date 2019 and was normal and last mammogram: approximate date 2021 and was normal. The patient does perform self breast exams.  There is no notable family history of breast or ovarian cancer in her family.  The patient has regular exercise: yes.  The patient denies current symptoms of depression.    GYN History: Contraception: oral progesterone-only contraceptive  PMHx: Past Medical History:  Diagnosis Date  . Hypertension    Past Surgical History:  Procedure Laterality Date  . CESAREAN SECTION     Family History  Problem Relation Age of Onset  . Diabetes Maternal Uncle   . Colon cancer Maternal Grandfather   . Cancer Paternal Grandfather   . Anxiety disorder Mother   . Depression Mother   . Hypertension Father    Social History   Tobacco Use  . Smoking status: Never Smoker  . Smokeless tobacco: Never Used  Vaping Use  . Vaping Use: Never used  Substance Use Topics  . Alcohol use: Yes    Comment: ocassionally   . Drug use: No    Current Outpatient Medications:  .  amLODipine (NORVASC) 5 MG tablet, Take 1 tablet (5 mg total) by mouth daily., Disp: 90 tablet, Rfl: 1 .  FLUoxetine (PROZAC) 40 MG capsule, TAKE 1 CAPSULE BY MOUTH EVERY DAY, Disp: 90 capsule, Rfl: 3 .  fluticasone (FLONASE) 50 MCG/ACT nasal spray, Place 2 sprays into both nostrils daily., Disp: 16 g, Rfl: 6 .  norethindrone (MICRONOR) 0.35 MG tablet, Take 1 tablet (0.35 mg total) by mouth daily., Disp: 90 tablet, Rfl: 3 .  tolterodine (DETROL LA) 4 MG 24 hr capsule, Take 1 capsule  (4 mg total) by mouth daily., Disp: 90 capsule, Rfl: 3 .  azithromycin (ZITHROMAX) 250 MG tablet, z-pack - take as directed for 5 days (Patient not taking: Reported on 05/04/2020), Disp: 6 tablet, Rfl: 0 Allergies: Patient has no known allergies.  Review of Systems  Constitutional: Negative for chills, fever and malaise/fatigue.  HENT: Negative for congestion, sinus pain and sore throat.   Eyes: Negative for blurred vision and pain.  Respiratory: Negative for cough and wheezing.   Cardiovascular: Negative for chest pain and leg swelling.  Gastrointestinal: Negative for abdominal pain, constipation, diarrhea, heartburn, nausea and vomiting.  Genitourinary: Negative for dysuria, frequency, hematuria and urgency.  Musculoskeletal: Negative for back pain, joint pain, myalgias and neck pain.  Skin: Negative for itching and rash.  Neurological: Negative for dizziness, tremors and weakness.  Endo/Heme/Allergies: Does not bruise/bleed easily.  Psychiatric/Behavioral: Negative for depression. The patient is not nervous/anxious and does not have insomnia.     Objective: BP 122/80   Ht 5\' 2"  (1.575 m)   Wt 174 lb (78.9 kg)   LMP 04/13/2020   BMI 31.83 kg/m   Filed Weights   05/04/20 1542  Weight: 174 lb (78.9 kg)   Body mass index is 31.83 kg/m. Physical Exam Constitutional:      General: She is not in acute distress.    Appearance: She is well-developed.  Genitourinary:  Pelvic exam was performed with patient supine.     Vagina, uterus and rectum normal.     No lesions in the vagina.     No vaginal bleeding.     No cervical motion tenderness, friability, lesion or polyp.     Uterus is mobile.     Uterus is not enlarged.     No uterine mass detected.    Uterus is midaxial.     No right or left adnexal mass present.     Right adnexa not tender.     Left adnexa not tender.  HENT:     Head: Normocephalic and atraumatic. No laceration.     Right Ear: Hearing normal.     Left  Ear: Hearing normal.     Mouth/Throat:     Pharynx: Uvula midline.  Eyes:     Pupils: Pupils are equal, round, and reactive to light.  Neck:     Thyroid: No thyromegaly.  Cardiovascular:     Rate and Rhythm: Normal rate and regular rhythm.     Heart sounds: No murmur heard.  No friction rub. No gallop.   Pulmonary:     Effort: Pulmonary effort is normal. No respiratory distress.     Breath sounds: Normal breath sounds. No wheezing.  Chest:     Breasts:        Right: No mass, skin change or tenderness.        Left: No mass, skin change or tenderness.  Abdominal:     General: Bowel sounds are normal. There is no distension.     Palpations: Abdomen is soft.     Tenderness: There is no abdominal tenderness. There is no rebound.  Musculoskeletal:        General: Normal range of motion.     Cervical back: Normal range of motion and neck supple.  Neurological:     Mental Status: She is alert and oriented to person, place, and time.     Cranial Nerves: No cranial nerve deficit.  Skin:    General: Skin is warm and dry.  Psychiatric:        Judgment: Judgment normal.  Vitals reviewed.     Assessment:  ANNUAL EXAM 1. Screen for colon cancer   2. Encounter for surveillance of contraceptive pills      Screening Plan:            1.  Cervical Screening-  Pap smear schedule reviewed with patient  2. Breast screening- Exam annually and mammogram>40 planned   3. Colonoscopy every 10 years, Hemoccult testing - soon  4. Labs managed by PCP  5. Counseling for contraception: oral progesterone-only contraceptive - norethindrone (MICRONOR) 0.35 MG tablet; Take 1 tablet (0.35 mg total) by mouth daily.  Dispense: 90 tablet; Refill: 3  Cont Prozac for now, consider transition off after retirement Cont Detrol LA for OAB    F/U  Return in about 1 year (around 05/04/2021) for Annual.  Barnett Applebaum, MD, [facility], [facility] 05/04/2020  4:30 PM

## 2020-05-04 NOTE — Patient Instructions (Signed)
PAP every three years Mammogram every year Colonoscopy every 10 years Labs yearly (with PCP)  Thank you for choosing [facility]. As part of our ongoing efforts to improve patient experience, we would appreciate your feedback. Please fill out the short survey that you will receive by mail or MyChart. Your opinion is important to [place]! - Dr. Kenton Kingfisher

## 2020-05-16 ENCOUNTER — Other Ambulatory Visit: Payer: Self-pay

## 2020-05-16 ENCOUNTER — Telehealth (INDEPENDENT_AMBULATORY_CARE_PROVIDER_SITE_OTHER): Payer: Self-pay | Admitting: Gastroenterology

## 2020-05-16 DIAGNOSIS — Z1211 Encounter for screening for malignant neoplasm of colon: Secondary | ICD-10-CM

## 2020-05-16 DIAGNOSIS — Z800 Family history of malignant neoplasm of digestive organs: Secondary | ICD-10-CM

## 2020-05-16 MED ORDER — PEG 3350-KCL-NA BICARB-NACL 420 G PO SOLR: 4000.0000 mL | Freq: Once | ORAL | 0 refills | Status: AC

## 2020-05-16 NOTE — Progress Notes (Signed)
Gastroenterology Pre-Procedure Review  Request Date: Friday 06/24/20 Requesting Physician: Dr. Bonna Gains  PATIENT REVIEW QUESTIONS: The patient responded to the following health history questions as indicated:    1. Are you having any GI issues? no 2. Do you have a personal history of Polyps? no 3. Do you have a family history of Colon Cancer or Polyps? yes (grandfather colon cancer)mother colon polyps 4. Diabetes Mellitus? no 5. Joint replacements in the past 12 months?no 6. Major health problems in the past 3 months?no 7. Any artificial heart valves, MVP, or defibrillator?no    MEDICATIONS & ALLERGIES:    Patient reports the following regarding taking any anticoagulation/antiplatelet therapy:   Plavix, Coumadin, Eliquis, Xarelto, Lovenox, Pradaxa, Brilinta, or Effient? no Aspirin? no  Patient confirms/reports the following medications:  Current Outpatient Medications  Medication Sig Dispense Refill  . amLODipine (NORVASC) 5 MG tablet Take 1 tablet (5 mg total) by mouth daily. 90 tablet 1  . FLUoxetine (PROZAC) 40 MG capsule TAKE 1 CAPSULE BY MOUTH EVERY DAY 90 capsule 3  . fluticasone (FLONASE) 50 MCG/ACT nasal spray Place 2 sprays into both nostrils daily. 16 g 6  . norethindrone (MICRONOR) 0.35 MG tablet Take 1 tablet (0.35 mg total) by mouth daily. 90 tablet 3  . tolterodine (DETROL LA) 4 MG 24 hr capsule Take 1 capsule (4 mg total) by mouth daily. 90 capsule 3  . azithromycin (ZITHROMAX) 250 MG tablet z-pack - take as directed for 5 days (Patient not taking: Reported on 05/04/2020) 6 tablet 0   No current facility-administered medications for this visit.    Patient confirms/reports the following allergies:  No Known Allergies  No orders of the defined types were placed in this encounter.   AUTHORIZATION INFORMATION Primary Insurance: 1D#: Group #:  Secondary Insurance: 1D#: Group #:  SCHEDULE INFORMATION: Date: Friday 06/24/20 Time: Location:[facility]

## 2020-06-21 ENCOUNTER — Other Ambulatory Visit: Payer: Self-pay

## 2020-06-21 MED ORDER — PEG 3350-KCL-NA BICARB-NACL 420 G PO SOLR: 4000.0000 mL | Freq: Once | ORAL | 0 refills | Status: AC

## 2020-06-21 NOTE — Progress Notes (Signed)
Procedure reschedule and updated instructions sent.

## 2020-08-16 ENCOUNTER — Telehealth: Payer: Self-pay

## 2020-08-16 NOTE — Telephone Encounter (Signed)
Patient LVM to cancel her colonoscopy due to a family emergency.  I returned her call to let her know that her colonoscopy has been canceled and she can call the office back to reschedule when she is ready.  Thanks,  [place], [place]

## 2020-08-17 ENCOUNTER — Other Ambulatory Visit: Admission: RE | Admit: 2020-08-17 | Payer: Self-pay | Source: Ambulatory Visit

## 2020-08-19 ENCOUNTER — Ambulatory Visit
Admission: RE | Admit: 2020-08-19 | Payer: BC Managed Care – PPO | Source: Home / Self Care | Admitting: Gastroenterology

## 2020-08-19 ENCOUNTER — Encounter: Admission: RE | Payer: Self-pay | Source: Home / Self Care

## 2020-08-19 ENCOUNTER — Encounter: Payer: BC Managed Care – PPO | Admitting: Nurse Practitioner

## 2020-08-19 SURGERY — COLONOSCOPY WITH PROPOFOL
Anesthesia: General

## 2020-08-31 ENCOUNTER — Other Ambulatory Visit: Payer: Self-pay

## 2020-08-31 DIAGNOSIS — I10 Essential (primary) hypertension: Secondary | ICD-10-CM

## 2020-08-31 MED ORDER — AMLODIPINE BESYLATE 5 MG PO TABS: 5.0000 mg | ORAL_TABLET | Freq: Every day | ORAL | 0 refills | Status: DC

## 2020-09-22 ENCOUNTER — Ambulatory Visit: Payer: Self-pay | Admitting: Physician Assistant

## 2020-09-27 ENCOUNTER — Encounter: Payer: BC Managed Care – PPO | Admitting: Nurse Practitioner

## 2020-10-03 ENCOUNTER — Ambulatory Visit: Payer: BC Managed Care – PPO | Admitting: Physician Assistant

## 2020-10-03 ENCOUNTER — Other Ambulatory Visit: Payer: Self-pay

## 2020-10-03 ENCOUNTER — Encounter: Payer: Self-pay | Admitting: Physician Assistant

## 2020-10-03 DIAGNOSIS — Z0189 Encounter for other specified special examinations: Secondary | ICD-10-CM | POA: Diagnosis not present

## 2020-10-03 DIAGNOSIS — F411 Generalized anxiety disorder: Secondary | ICD-10-CM

## 2020-10-03 DIAGNOSIS — I10 Essential (primary) hypertension: Secondary | ICD-10-CM | POA: Diagnosis not present

## 2020-10-03 MED ORDER — AMLODIPINE BESYLATE 5 MG PO TABS: 5.0000 mg | ORAL_TABLET | Freq: Every day | ORAL | 1 refills | Status: DC

## 2020-10-03 NOTE — Progress Notes (Signed)
[facility] [place], [place] [address]  Internal MEDICINE  Office Visit Note  Patient Name: Stephanie Ferrell  [id number]  [id number]  Date of Service: 10/11/2020  Chief Complaint  Patient presents with  . Follow-up  . Hypertension    HPI Pt is here for routine f/u. She has no complaints today. -BP not checked at home. She does need refills of norvasc today. BP normally rises when stressed from work. Pt is a Pharmacist, hospital. Denies headaches or vision changes. Does have stinging in eyes at end of day from looking at screens. She plans to f/u with eye doctor. -Fluoxetine does well for her. No symptoms of anxiety or depression. This is prescribed by OBGYN along with her OCP. -Scheduled colonscopy but had to r/s and is working on this. Mammogram done last year through OBGYN.  Current Medication: Outpatient Encounter Medications as of 10/03/2020  Medication Sig  . FLUoxetine (PROZAC) 40 MG capsule TAKE 1 CAPSULE BY MOUTH EVERY DAY  . fluticasone (FLONASE) 50 MCG/ACT nasal spray Place 2 sprays into both nostrils daily.  . norethindrone (MICRONOR) 0.35 MG tablet Take 1 tablet (0.35 mg total) by mouth daily.  Marland Kitchen tolterodine (DETROL LA) 4 MG 24 hr capsule Take 1 capsule (4 mg total) by mouth daily.  . [DISCONTINUED] amLODipine (NORVASC) 5 MG tablet Take 1 tablet (5 mg total) by mouth daily.  Marland Kitchen amLODipine (NORVASC) 5 MG tablet Take 1 tablet (5 mg total) by mouth daily.  . [DISCONTINUED] azithromycin (ZITHROMAX) 250 MG tablet z-pack - take as directed for 5 days (Patient not taking: No sig reported)   No facility-administered encounter medications on file as of 10/03/2020.    Surgical History: Past Surgical History:  Procedure Laterality Date  . CESAREAN SECTION      Medical History: Past Medical History:  Diagnosis Date  . Hypertension     Family History: Family History  Problem Relation Age of Onset  . Diabetes Maternal Uncle   . Colon cancer Maternal  Grandfather   . Cancer Paternal Grandfather   . Anxiety disorder Mother   . Depression Mother   . Hypertension Father     Social History   Socioeconomic History  . Marital status: Married    Spouse name: Not on file  . Number of children: Not on file  . Years of education: Not on file  . Highest education level: Not on file  Occupational History  . Not on file  Tobacco Use  . Smoking status: Never Smoker  . Smokeless tobacco: Never Used  Vaping Use  . Vaping Use: Never used  Substance and Sexual Activity  . Alcohol use: Yes    Comment: ocassionally   . Drug use: No  . Sexual activity: Yes    Birth control/protection: Pill  Other Topics Concern  . Not on file  Social History Narrative  . Not on file   Social Determinants of Health   Financial Resource Strain: Not on file  Food Insecurity: Not on file  Transportation Needs: Not on file  Physical Activity: Not on file  Stress: Not on file  Social Connections: Not on file  Intimate Partner Violence: Not on file      Review of Systems  Constitutional: Negative for chills, fatigue and unexpected weight change.  HENT: Negative for congestion, postnasal drip, rhinorrhea, sneezing and sore throat.   Eyes: Positive for pain. Negative for redness.       Eye strain/burning after looking at screens  Respiratory:  Negative for cough, chest tightness and shortness of breath.   Cardiovascular: Negative for chest pain and palpitations.  Gastrointestinal: Negative for abdominal pain, constipation, diarrhea, nausea and vomiting.  Genitourinary: Negative for dysuria and frequency.  Musculoskeletal: Negative for arthralgias, back pain, joint swelling and neck pain.  Skin: Negative for rash.  Neurological: Negative.  Negative for tremors and numbness.  Hematological: Negative for adenopathy. Does not bruise/bleed easily.  Psychiatric/Behavioral: Negative for behavioral problems (Depression), sleep disturbance and suicidal ideas.  The patient is not nervous/anxious.     Vital Signs: BP 129/77   Pulse 87   Temp 97.7 F (36.5 C)   Resp 16   Ht 5\' 2"  (1.575 m)   Wt 177 lb (80.3 kg)   SpO2 97%   BMI 32.37 kg/m    Physical Exam Constitutional:      General: She is not in acute distress.    Appearance: She is well-developed. She is obese. She is not diaphoretic.  HENT:     Head: Normocephalic and atraumatic.     Mouth/Throat:     Pharynx: No oropharyngeal exudate.  Eyes:     Pupils: Pupils are equal, round, and reactive to light.  Neck:     Thyroid: No thyromegaly.     Vascular: No JVD.     Trachea: No tracheal deviation.  Cardiovascular:     Rate and Rhythm: Normal rate and regular rhythm.     Heart sounds: Normal heart sounds. No murmur heard. No friction rub. No gallop.   Pulmonary:     Effort: Pulmonary effort is normal. No respiratory distress.     Breath sounds: No wheezing or rales.  Chest:     Chest wall: No tenderness.  Abdominal:     General: Bowel sounds are normal.     Palpations: Abdomen is soft.  Musculoskeletal:        General: Normal range of motion.     Cervical back: Normal range of motion and neck supple.  Lymphadenopathy:     Cervical: No cervical adenopathy.  Skin:    General: Skin is warm and dry.  Neurological:     Mental Status: She is alert and oriented to person, place, and time.     Cranial Nerves: No cranial nerve deficit.  Psychiatric:        Behavior: Behavior normal.        Thought Content: Thought content normal.        Judgment: Judgment normal.        Assessment/Plan: 1. Essential hypertension Well controlled. Continue Norvasc, refill sent. - amLODipine (NORVASC) 5 MG tablet; Take 1 tablet (5 mg total) by mouth daily.  Dispense: 90 tablet; Refill: 1  2. GAD (generalized anxiety disorder) Continue Prozac--prescribed by OBGYN  3. Encounter for routine laboratory testing Pt is due for physical and routine fasting labs, lab slip given today to have  done prior to physical next visit   General Counseling: Adaly verbalizes understanding of the findings of todays visit and agrees with plan of treatment. I have discussed any further diagnostic evaluation that may be needed or ordered today. We also reviewed her medications today. she has been encouraged to call the office with any questions or concerns that should arise related to todays visit.    No orders of the defined types were placed in this encounter.   Meds ordered this encounter  Medications  . amLODipine (NORVASC) 5 MG tablet    Sig: Take 1 tablet (5 mg total) by  mouth daily.    Dispense:  90 tablet    Refill:  1    Pt need appt for refills    This patient was seen by Drema Dallas, PA-C in collaboration with Dr. Clayborn Bigness as a part of collaborative care agreement.   Total time spent:30 Minutes Time spent includes review of chart, medications, test results, and follow up plan with the patient.      Dr Lavera Guise Internal medicine

## 2021-01-03 ENCOUNTER — Encounter: Payer: BC Managed Care – PPO | Admitting: Nurse Practitioner

## 2021-01-04 ENCOUNTER — Other Ambulatory Visit: Payer: Self-pay | Admitting: Physician Assistant

## 2021-01-05 LAB — B12 AND FOLATE PANEL
Folate: 16.3 ng/mL (ref >3.0)
Vitamin B-12: 310 pg/mL (ref 232–1245)

## 2021-01-05 LAB — CBC WITH DIFFERENTIAL/PLATELET
Basophils Absolute: 0.1 10*3/uL (ref 0.0–0.2)
Basos: 1 %
EOS (ABSOLUTE): 0.1 10*3/uL (ref 0.0–0.4)
Eos: 2 %
Hematocrit: 41.4 % (ref 34.0–46.6)
Hemoglobin: 13.2 g/dL (ref 11.1–15.9)
Immature Grans (Abs): 0 10*3/uL (ref 0.0–0.1)
Immature Granulocytes: 0 %
Lymphocytes Absolute: 1.6 10*3/uL (ref 0.7–3.1)
Lymphs: 34 %
MCH: 28.9 pg (ref 26.6–33.0)
MCHC: 31.9 g/dL (ref 31.5–35.7)
MCV: 91 fL (ref 79–97)
Monocytes Absolute: 0.4 10*3/uL (ref 0.1–0.9)
Monocytes: 9 %
Neutrophils Absolute: 2.6 10*3/uL (ref 1.4–7.0)
Neutrophils: 54 %
Platelets: 336 10*3/uL (ref 150–450)
RBC: 4.57 x10E6/uL (ref 3.77–5.28)
RDW: 13.4 % (ref 11.7–15.4)
WBC: 4.8 10*3/uL (ref 3.4–10.8)

## 2021-01-05 LAB — COMPREHENSIVE METABOLIC PANEL
ALT: 11 IU/L (ref 0–32)
AST: 14 IU/L (ref 0–40)
Albumin/Globulin Ratio: 1.8 (ref 1.2–2.2)
Albumin: 4.4 g/dL (ref 3.8–4.8)
Alkaline Phosphatase: 64 IU/L (ref 44–121)
BUN/Creatinine Ratio: 12 (ref 9–23)
BUN: 9 mg/dL (ref 6–24)
Bilirubin Total: 0.5 mg/dL (ref 0.0–1.2)
CO2: 22 mmol/L (ref 20–29)
Calcium: 9 mg/dL (ref 8.7–10.2)
Chloride: 103 mmol/L (ref 96–106)
Creatinine, Ser: 0.74 mg/dL (ref 0.57–1.00)
Globulin, Total: 2.5 g/dL (ref 1.5–4.5)
Glucose: 102 mg/dL — ABNORMAL HIGH (ref 65–99)
Potassium: 4.3 mmol/L (ref 3.5–5.2)
Sodium: 139 mmol/L (ref 134–144)
Total Protein: 6.9 g/dL (ref 6.0–8.5)
eGFR: 99 mL/min/{1.73_m2} (ref >59)

## 2021-01-05 LAB — LIPID PANEL W/O CHOL/HDL RATIO
Cholesterol, Total: 179 mg/dL (ref 100–199)
HDL: 62 mg/dL (ref >39)
LDL Chol Calc (NIH): 99 mg/dL (ref 0–99)
Triglycerides: 102 mg/dL (ref 0–149)
VLDL Cholesterol Cal: 18 mg/dL (ref 5–40)

## 2021-01-05 LAB — VITAMIN D 25 HYDROXY (VIT D DEFICIENCY, FRACTURES): Vit D, 25-Hydroxy: 25.6 ng/mL — ABNORMAL LOW (ref 30.0–100.0)

## 2021-01-05 LAB — T4, FREE: Free T4: 0.87 ng/dL (ref 0.82–1.77)

## 2021-01-05 LAB — TSH: TSH: 0.939 u[IU]/mL (ref 0.450–4.500)

## 2021-01-12 ENCOUNTER — Encounter: Payer: Self-pay | Admitting: Nurse Practitioner

## 2021-01-12 ENCOUNTER — Other Ambulatory Visit: Payer: Self-pay

## 2021-01-12 ENCOUNTER — Other Ambulatory Visit: Payer: Self-pay | Admitting: Physician Assistant

## 2021-01-12 ENCOUNTER — Ambulatory Visit (INDEPENDENT_AMBULATORY_CARE_PROVIDER_SITE_OTHER): Payer: BC Managed Care – PPO | Admitting: Nurse Practitioner

## 2021-01-12 VITALS — BP 126/75 | HR 72 | Temp 98.6°F | Resp 16 | Ht 62.0 in | Wt 161.6 lb

## 2021-01-12 DIAGNOSIS — I10 Essential (primary) hypertension: Secondary | ICD-10-CM | POA: Diagnosis not present

## 2021-01-12 DIAGNOSIS — R5383 Other fatigue: Secondary | ICD-10-CM

## 2021-01-12 DIAGNOSIS — Z0001 Encounter for general adult medical examination with abnormal findings: Secondary | ICD-10-CM

## 2021-01-12 DIAGNOSIS — Z1211 Encounter for screening for malignant neoplasm of colon: Secondary | ICD-10-CM

## 2021-01-12 DIAGNOSIS — Z1212 Encounter for screening for malignant neoplasm of rectum: Secondary | ICD-10-CM

## 2021-01-12 DIAGNOSIS — Z1231 Encounter for screening mammogram for malignant neoplasm of breast: Secondary | ICD-10-CM

## 2021-01-12 DIAGNOSIS — F411 Generalized anxiety disorder: Secondary | ICD-10-CM

## 2021-01-12 DIAGNOSIS — R300 Dysuria: Secondary | ICD-10-CM

## 2021-01-12 DIAGNOSIS — G4733 Obstructive sleep apnea (adult) (pediatric): Secondary | ICD-10-CM

## 2021-01-12 DIAGNOSIS — Z0189 Encounter for other specified special examinations: Secondary | ICD-10-CM

## 2021-01-12 DIAGNOSIS — Z23 Encounter for immunization: Secondary | ICD-10-CM

## 2021-01-12 DIAGNOSIS — E538 Deficiency of other specified B group vitamins: Secondary | ICD-10-CM | POA: Diagnosis not present

## 2021-01-12 MED ORDER — CYANOCOBALAMIN 1000 MCG/ML IJ SOLN
1000.0000 ug | Freq: Once | INTRAMUSCULAR | Status: AC
  Administered 2021-01-12: 1000 ug via INTRAMUSCULAR

## 2021-01-12 MED ORDER — TETANUS-DIPHTHERIA TOXOIDS TD 5-2 LFU IM INJ: 0.5000 mL | INJECTION | Freq: Once | INTRAMUSCULAR | 0 refills | Status: AC

## 2021-01-12 NOTE — Progress Notes (Signed)
[facility] [place], [place] [address]  Internal MEDICINE  Office Visit Note  Patient Name: Stephanie Ferrell  [id number]  [id number]  Date of Service: 01/19/2021  Chief Complaint  Patient presents with   Annual Exam    Cpe    Quality Metric Gaps    colonoscopy    HPI Stephanie Ferrell presents for an annual well visit and physical exam. she has a history of hypertension and a C-section. She lives at home with her husband and 50 yo son. She also has a 65 yo daughter who does not live at home. She is due for her mammogram and her colonoscopy. She does not need any refills. She is due for her tetanus vaccine. She had her labs done on 01/04/21. Her vitamin B12 was low at 310. Vitamin D was also low. She denies any pain.  She has c/o fatigue, labs have already been drawn. Thyroid was normal.     Current Medication: Outpatient Encounter Medications as of 01/12/2021  Medication Sig   FLUoxetine (PROZAC) 40 MG capsule TAKE 1 CAPSULE BY MOUTH EVERY DAY   fluticasone (FLONASE) 50 MCG/ACT nasal spray Place 2 sprays into both nostrils daily.   norethindrone (MICRONOR) 0.35 MG tablet Take 1 tablet (0.35 mg total) by mouth daily.   [EXPIRED] tetanus & diphtheria toxoids, adult, (TENIVAC) 5-2 LFU injection Inject 0.5 mLs into the muscle once for 1 dose.   tolterodine (DETROL LA) 4 MG 24 hr capsule Take 1 capsule (4 mg total) by mouth daily.   [DISCONTINUED] amLODipine (NORVASC) 5 MG tablet Take 1 tablet (5 mg total) by mouth daily.   [EXPIRED] cyanocobalamin ((VITAMIN B-12)) injection 1,000 mcg    No facility-administered encounter medications on file as of 01/12/2021.    Surgical History: Past Surgical History:  Procedure Laterality Date   CESAREAN SECTION      Medical History: Past Medical History:  Diagnosis Date   Hypertension     Family History: Family History  Problem Relation Age of Onset   Diabetes Maternal Uncle    Colon cancer Maternal Grandfather     Cancer Paternal Grandfather    Anxiety disorder Mother    Depression Mother    Hypertension Father     Social History   Socioeconomic History   Marital status: Married    Spouse name: Not on file   Number of children: Not on file   Years of education: Not on file   Highest education level: Not on file  Occupational History   Not on file  Tobacco Use   Smoking status: Never   Smokeless tobacco: Never  Vaping Use   Vaping Use: Never used  Substance and Sexual Activity   Alcohol use: Yes    Comment: ocassionally    Drug use: No   Sexual activity: Yes    Birth control/protection: Pill  Other Topics Concern   Not on file  Social History Narrative   Not on file   Social Determinants of Health   Financial Resource Strain: Not on file  Food Insecurity: Not on file  Transportation Needs: Not on file  Physical Activity: Not on file  Stress: Not on file  Social Connections: Not on file  Intimate Partner Violence: Not on file      Review of Systems  Constitutional:  Positive for fatigue. Negative for activity change, appetite change, chills, fever and unexpected weight change.  HENT: Negative.  Negative for congestion, ear pain, rhinorrhea, sore throat and trouble swallowing.  Eyes: Negative.   Respiratory: Negative.  Negative for cough, chest tightness, shortness of breath and wheezing.   Cardiovascular: Negative.  Negative for chest pain.  Gastrointestinal: Negative.  Negative for abdominal pain, blood in stool, constipation, diarrhea, nausea and vomiting.  Endocrine: Negative.   Genitourinary: Negative.  Negative for difficulty urinating, dysuria, frequency, hematuria and urgency.  Musculoskeletal: Negative.  Negative for arthralgias, back pain, joint swelling, myalgias and neck pain.  Skin: Negative.  Negative for rash and wound.  Allergic/Immunologic: Negative.  Negative for immunocompromised state.  Neurological: Negative.  Negative for dizziness, seizures,  numbness and headaches.  Hematological: Negative.   Psychiatric/Behavioral: Negative.  Negative for behavioral problems, self-injury and suicidal ideas. The patient is not nervous/anxious.    Vital Signs: BP 126/75   Pulse 72   Temp 98.6 F (37 C)   Resp 16   Ht 5\' 2"  (1.575 m)   Wt 161 lb 9.6 oz (73.3 kg)   SpO2 97%   BMI 29.56 kg/m    Physical Exam Constitutional:      General: She is not in acute distress.    Appearance: Normal appearance. She is normal weight. She is not ill-appearing.  HENT:     Head: Normocephalic and atraumatic.     Right Ear: Tympanic membrane, ear canal and external ear normal.     Left Ear: Tympanic membrane, ear canal and external ear normal.     Nose: Nose normal. No congestion or rhinorrhea.     Mouth/Throat:     Mouth: Mucous membranes are moist.     Pharynx: Oropharynx is clear. No oropharyngeal exudate or posterior oropharyngeal erythema.  Eyes:     Extraocular Movements: Extraocular movements intact.     Conjunctiva/sclera: Conjunctivae normal.     Pupils: Pupils are equal, round, and reactive to light.  Cardiovascular:     Rate and Rhythm: Normal rate and regular rhythm.     Pulses: Normal pulses.     Heart sounds: Normal heart sounds. No murmur heard. Pulmonary:     Effort: Pulmonary effort is normal. No respiratory distress.     Breath sounds: Normal breath sounds.  Abdominal:     General: Bowel sounds are normal. There is no distension.     Palpations: Abdomen is soft. There is no mass.     Tenderness: There is no abdominal tenderness. There is no guarding or rebound.     Hernia: No hernia is present.  Musculoskeletal:        General: Normal range of motion.     Cervical back: Normal range of motion and neck supple.  Lymphadenopathy:     Cervical: No cervical adenopathy.  Skin:    General: Skin is warm and dry.     Capillary Refill: Capillary refill takes less than 2 seconds.  Neurological:     Mental Status: She is alert and  oriented to person, place, and time.  Psychiatric:        Mood and Affect: Mood normal.        Behavior: Behavior normal.        Thought Content: Thought content normal.        Judgment: Judgment normal.       Assessment/Plan: 1. Encounter for routine adult health examination with abnormal findings Age-appropriate preventive screenings discussed, annual physical exam completed. Routine labs done prior to exam. Lab results reviewed with patient.   2. OSA (obstructive sleep apnea) Possible sleep apnea, patient snores, wakes up gasping or short of breath  sometimes and wakes up feeling exhausted in the morning. Sleep study ordered.  - PSG Sleep Study; Future  3. B12 deficiency Started monthly B12 injections today.  - cyanocobalamin ((VITAMIN B-12)) injection 1,000 mcg  4. Essential hypertension Stable with current medications  5. GAD (generalized anxiety disorder) Stable with current medications.   6. Encounter for vaccination Due for tetanus booster, sent to pharmacy - tetanus & diphtheria toxoids, adult, (TENIVAC) 5-2 LFU injection; Inject 0.5 mLs into the muscle once for 1 dose.  Dispense: 0.5 mL; Refill: 0  7. Encounter for screening mammogram for breast cancer Mammogram ordered. - MM Digital Screening; Future  8. Screening for colorectal cancer Referral sent for screening colonoscopy with GI - Ambulatory referral to Gastroenterology  10. Dysuria Routine urinalysis with reflex culture sent.  - UA/M w/rflx Culture, Routine - Microscopic Examination - Urine Culture, Reflex     General Counseling: Hasel verbalizes understanding of the findings of todays visit and agrees with plan of treatment. I have discussed any further diagnostic evaluation that may be needed or ordered today. We also reviewed her medications today. she has been encouraged to call the office with any questions or concerns that should arise related to todays visit.    Orders Placed This  Encounter  Procedures   Microscopic Examination   Urine Culture, Reflex   MM Digital Screening   UA/M w/rflx Culture, Routine   Ambulatory referral to Gastroenterology   PSG Sleep Study    Meds ordered this encounter  Medications   tetanus & diphtheria toxoids, adult, (TENIVAC) 5-2 LFU injection    Sig: Inject 0.5 mLs into the muscle once for 1 dose.    Dispense:  0.5 mL    Refill:  0   cyanocobalamin ((VITAMIN B-12)) injection 1,000 mcg    Return in about 6 months (around 07/14/2021) for F/U, med refill, Slate Debroux PCP. Please schedule nurse visit in 1 month for B12 injection   Total time spent:30 Minutes Time spent includes review of chart, medications, test results, and follow up plan with the patient.   [place] Controlled Substance Database was reviewed by me.  This patient was seen by Jonetta Osgood, FNP-C in collaboration with Dr. Clayborn Bigness as a part of collaborative care agreement.  Joyell Emami R. Valetta Fuller, MSN, FNP-C Internal medicine

## 2021-01-15 LAB — URINE CULTURE, REFLEX: Organism ID, Bacteria: NO GROWTH

## 2021-01-15 LAB — UA/M W/RFLX CULTURE, ROUTINE
Bilirubin, UA: NEGATIVE
Glucose, UA: NEGATIVE
Ketones, UA: NEGATIVE
Nitrite, UA: NEGATIVE
Protein,UA: NEGATIVE
RBC, UA: NEGATIVE
Specific Gravity, UA: 1.018 (ref 1.005–1.030)
Urobilinogen, Ur: 0.2 mg/dL (ref 0.2–1.0)
pH, UA: 6 (ref 5.0–7.5)

## 2021-01-15 LAB — MICROSCOPIC EXAMINATION: Casts: NONE SEEN /lpf

## 2021-01-16 ENCOUNTER — Other Ambulatory Visit: Payer: Self-pay

## 2021-01-16 DIAGNOSIS — Z1211 Encounter for screening for malignant neoplasm of colon: Secondary | ICD-10-CM

## 2021-01-16 MED ORDER — CLENPIQ 10-3.5-12 MG-GM -GM/160ML PO SOLN: 320.0000 mL | Freq: Once | ORAL | 0 refills | Status: AC

## 2021-01-19 DIAGNOSIS — E538 Deficiency of other specified B group vitamins: Secondary | ICD-10-CM | POA: Insufficient documentation

## 2021-01-26 IMAGING — MG DIGITAL SCREENING BILAT W/ TOMO W/ CAD
8 series · 8 of 24 positions shown · non-contrast
Comparison: Previous exam(s).

CLINICAL DATA: Screening.

EXAM:
DIGITAL SCREENING BILATERAL MAMMOGRAM WITH TOMO AND CAD

[L CC synth-2D]
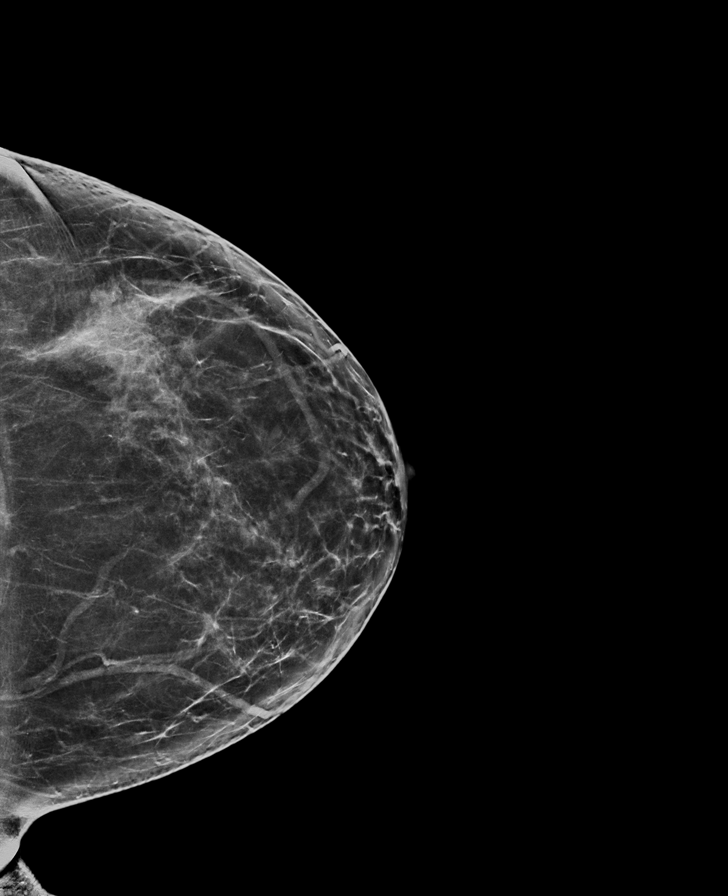

[R MLO synth-2D]
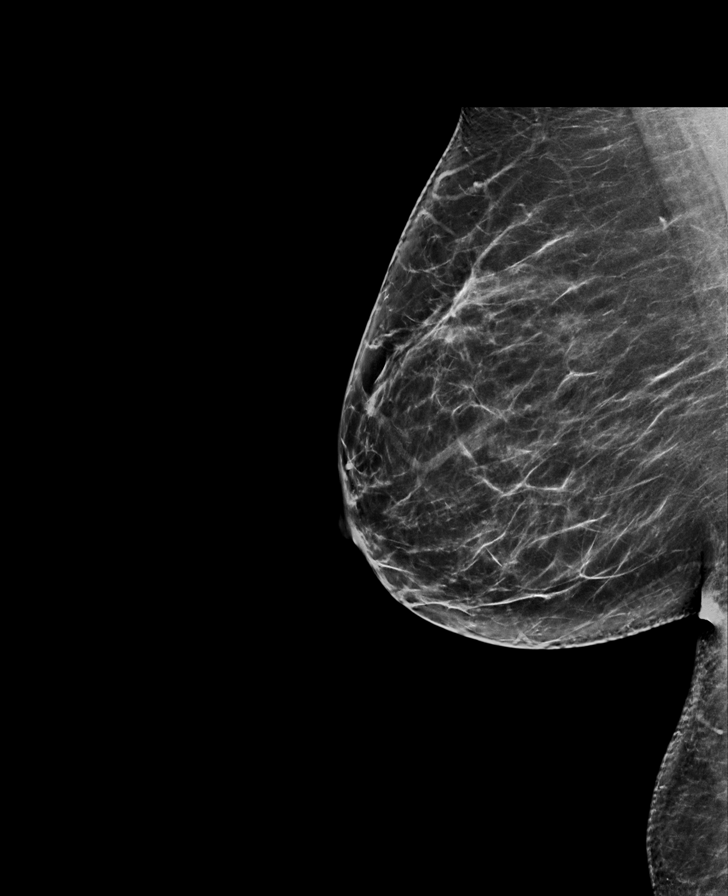

[L MLO synth-2D]
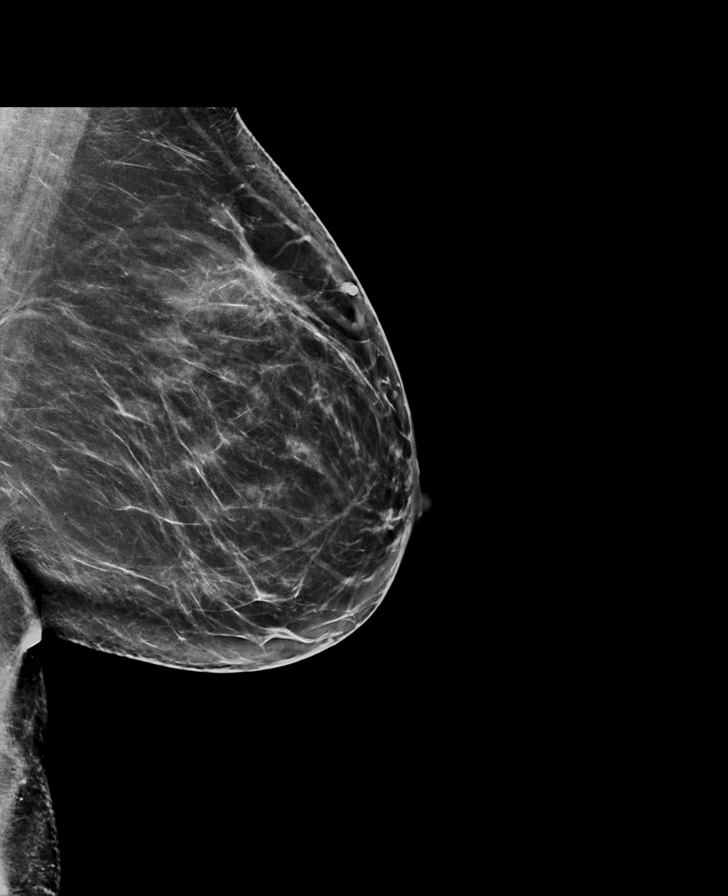

[R CC synth-2D]
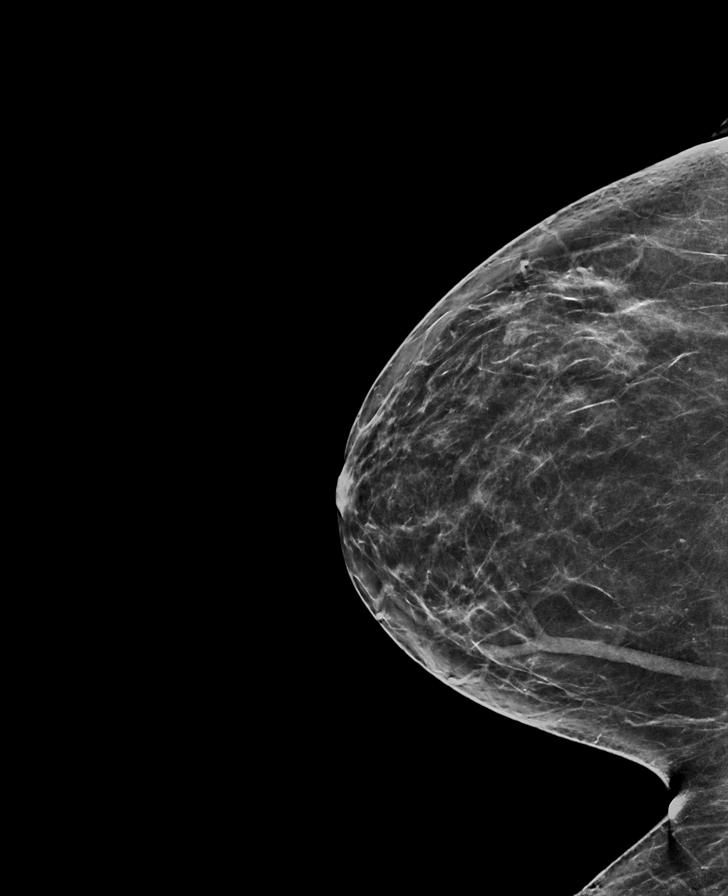

[L CC tomo · tomo slice 39/76.0]
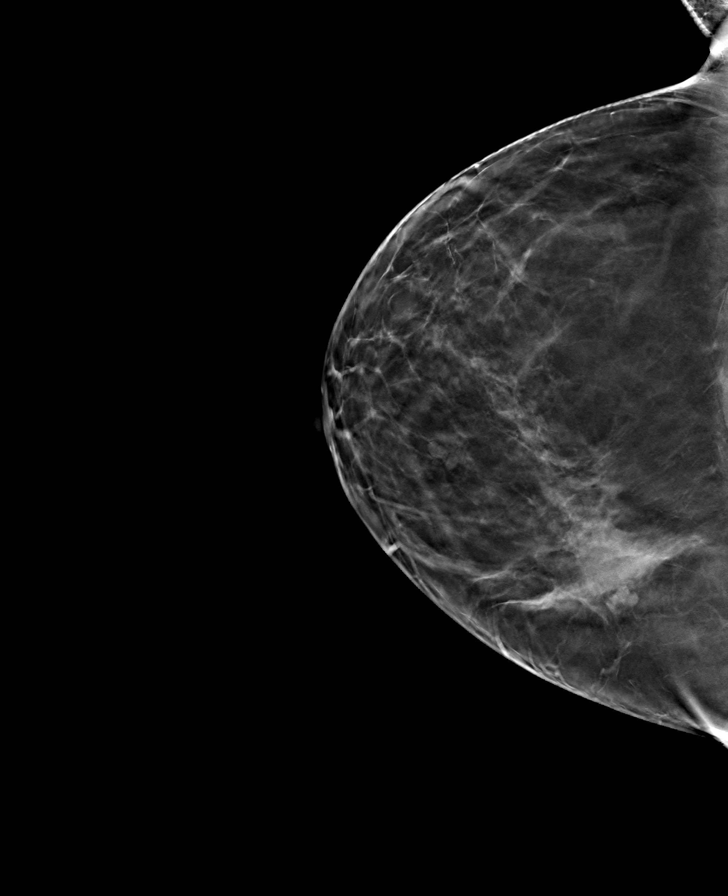

[L MLO tomo · tomo slice 41/82.0]
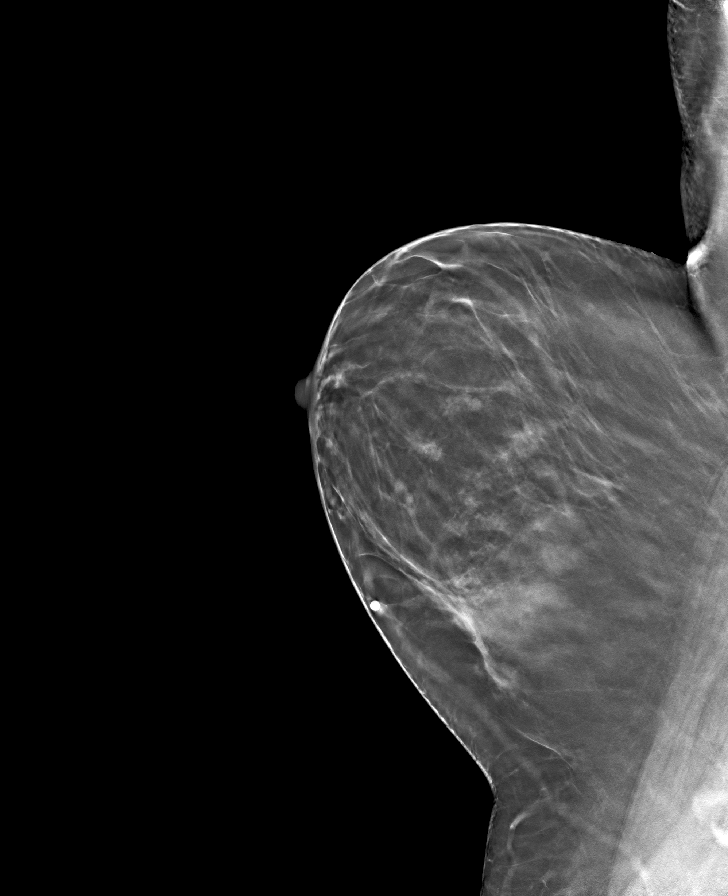

[R MLO tomo · tomo slice 40/79.0]
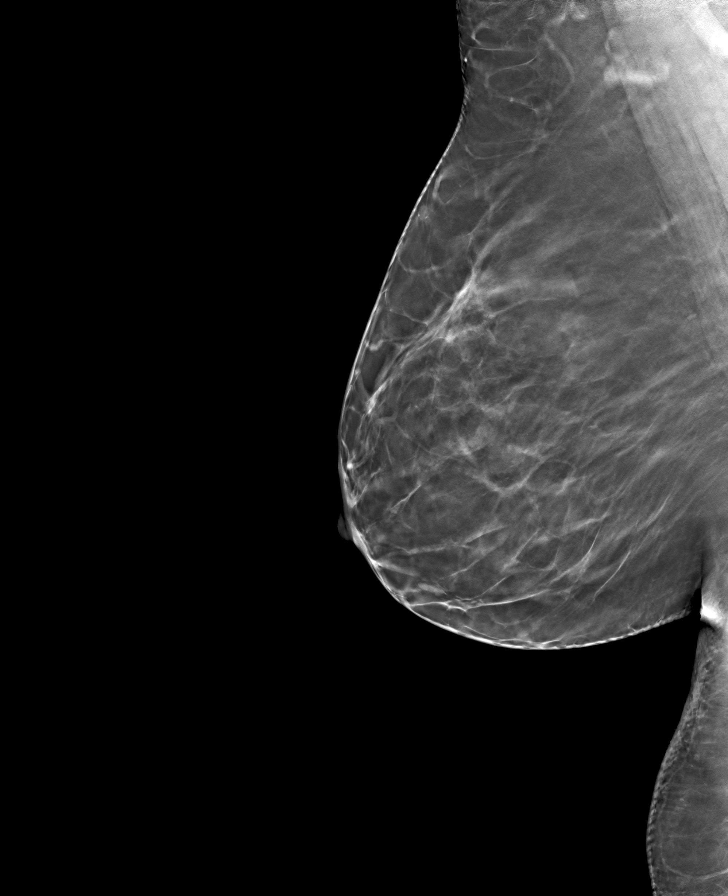

[R CC tomo · tomo slice 37/73.0]
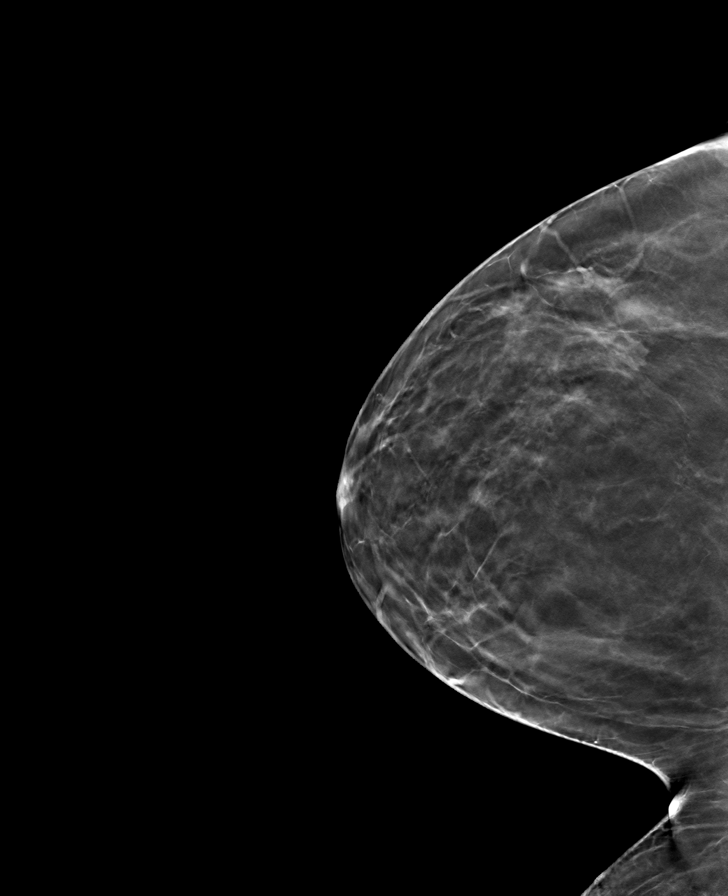

[8 of 24 positions shown; findings below may reference images not displayed]

ACR Breast Density Category b: There are scattered areas of
fibroglandular density.
FINDINGS: There are no findings suspicious for malignancy. Images were
processed with CAD.
IMPRESSION: No mammographic evidence of malignancy. A result letter of this
screening mammogram will be mailed directly to the patient.

RECOMMENDATION:
Screening mammogram in one year. (Code:CN-U-775)

BI-RADS CATEGORY  1: Negative.

## 2021-01-31 ENCOUNTER — Telehealth: Payer: Self-pay

## 2021-01-31 NOTE — Telephone Encounter (Signed)
Patient scheduled for PSG at [facility] on Thursday July 14th, 2022.

## 2021-02-02 ENCOUNTER — Encounter (INDEPENDENT_AMBULATORY_CARE_PROVIDER_SITE_OTHER): Payer: BC Managed Care – PPO | Admitting: Internal Medicine

## 2021-02-02 DIAGNOSIS — G4719 Other hypersomnia: Secondary | ICD-10-CM

## 2021-02-09 ENCOUNTER — Telehealth: Payer: Self-pay

## 2021-02-09 NOTE — Telephone Encounter (Signed)
Left vm to screen for 02/10/21 appointment-Toni

## 2021-02-10 ENCOUNTER — Encounter: Payer: Self-pay | Admitting: Physician Assistant

## 2021-02-10 ENCOUNTER — Other Ambulatory Visit: Payer: Self-pay

## 2021-02-10 ENCOUNTER — Ambulatory Visit: Payer: BC Managed Care – PPO | Admitting: Physician Assistant

## 2021-02-10 DIAGNOSIS — G4700 Insomnia, unspecified: Secondary | ICD-10-CM | POA: Diagnosis not present

## 2021-02-10 DIAGNOSIS — I10 Essential (primary) hypertension: Secondary | ICD-10-CM

## 2021-02-10 DIAGNOSIS — F411 Generalized anxiety disorder: Secondary | ICD-10-CM

## 2021-02-10 NOTE — Progress Notes (Signed)
[facility], [place] [address]  Internal MEDICINE  Office Visit Note  Patient Name: Stephanie Ferrell  [id number]  [id number]  Date of Service: 02/10/2021  Chief Complaint  Patient presents with   Follow-up    SS results    HPI Patient is here for routine follow up to discuss sleep study results which did not show signs of OSA. -Patient reports she has more difficulty initiating sleep but is trying melatonin about 30 minutes before bed and is helping. Goes to sleep around 11 and wakes up around 4 when husband goes to work but then goes back to bed until son wakes her up. Discussed trialing melatonin earlier in evening and setting earlier bed time if she is going to be awoken at 4am each morning. She is going to avoid naps and caffeine in late PM to help with getting to sleep earlier. -BP stable  Current Medication: Outpatient Encounter Medications as of 02/10/2021  Medication Sig   amLODipine (NORVASC) 5 MG tablet TAKE 1 TABLET BY MOUTH DAILY.   FLUoxetine (PROZAC) 40 MG capsule TAKE 1 CAPSULE BY MOUTH EVERY DAY   fluticasone (FLONASE) 50 MCG/ACT nasal spray Place 2 sprays into both nostrils daily.   norethindrone (MICRONOR) 0.35 MG tablet Take 1 tablet (0.35 mg total) by mouth daily.   tolterodine (DETROL LA) 4 MG 24 hr capsule Take 1 capsule (4 mg total) by mouth daily.   No facility-administered encounter medications on file as of 02/10/2021.    Surgical History: Past Surgical History:  Procedure Laterality Date   CESAREAN SECTION      Medical History: Past Medical History:  Diagnosis Date   Hypertension     Family History: Family History  Problem Relation Age of Onset   Diabetes Maternal Uncle    Colon cancer Maternal Grandfather    Cancer Paternal Grandfather    Anxiety disorder Mother    Depression Mother    Hypertension Father     Social History   Socioeconomic History   Marital status: Married    Spouse name: Not on  file   Number of children: Not on file   Years of education: Not on file   Highest education level: Not on file  Occupational History   Not on file  Tobacco Use   Smoking status: Never   Smokeless tobacco: Never  Vaping Use   Vaping Use: Never used  Substance and Sexual Activity   Alcohol use: Yes    Comment: ocassionally    Drug use: No   Sexual activity: Yes    Birth control/protection: Pill  Other Topics Concern   Not on file  Social History Narrative   Not on file   Social Determinants of Health   Financial Resource Strain: Not on file  Food Insecurity: Not on file  Transportation Needs: Not on file  Physical Activity: Not on file  Stress: Not on file  Social Connections: Not on file  Intimate Partner Violence: Not on file      Review of Systems  Constitutional:  Negative for chills, fatigue and unexpected weight change.  HENT:  Negative for congestion, postnasal drip, rhinorrhea, sneezing and sore throat.   Eyes:  Negative for redness.  Respiratory:  Negative for cough, chest tightness and shortness of breath.   Cardiovascular:  Negative for chest pain and palpitations.  Gastrointestinal:  Negative for abdominal pain, constipation, diarrhea, nausea and vomiting.  Genitourinary:  Negative for dysuria and frequency.  Musculoskeletal:  Negative for arthralgias, back pain, joint swelling and neck pain.  Skin:  Negative for rash.  Neurological: Negative.  Negative for tremors and numbness.  Hematological:  Negative for adenopathy. Does not bruise/bleed easily.  Psychiatric/Behavioral:  Negative for behavioral problems (Depression), sleep disturbance and suicidal ideas. The patient is not nervous/anxious.    Vital Signs: BP 129/79   Pulse 74   Temp 98.4 F (36.9 C)   Resp 16   Ht '5\' 2"'$  (1.575 m)   Wt 158 lb 12.8 oz (72 kg)   SpO2 98%   BMI 29.04 kg/m    Physical Exam Vitals and nursing note reviewed.  Constitutional:      General: She is not in acute  distress.    Appearance: She is well-developed. She is not diaphoretic.  HENT:     Head: Normocephalic and atraumatic.     Mouth/Throat:     Pharynx: No oropharyngeal exudate.  Eyes:     Pupils: Pupils are equal, round, and reactive to light.  Neck:     Thyroid: No thyromegaly.     Vascular: No JVD.     Trachea: No tracheal deviation.  Cardiovascular:     Rate and Rhythm: Normal rate and regular rhythm.     Heart sounds: Normal heart sounds. No murmur heard.   No friction rub. No gallop.  Pulmonary:     Effort: Pulmonary effort is normal. No respiratory distress.     Breath sounds: No wheezing or rales.  Chest:     Chest wall: No tenderness.  Abdominal:     General: Bowel sounds are normal.     Palpations: Abdomen is soft.  Musculoskeletal:        General: Normal range of motion.     Cervical back: Normal range of motion and neck supple.  Lymphadenopathy:     Cervical: No cervical adenopathy.  Skin:    General: Skin is warm and dry.  Neurological:     Mental Status: She is alert and oriented to person, place, and time.     Cranial Nerves: No cranial nerve deficit.  Psychiatric:        Behavior: Behavior normal.        Thought Content: Thought content normal.        Judgment: Judgment normal.       Assessment/Plan: 1. Essential hypertension Bp stable, continue norvasc  2. GAD (generalized anxiety disorder) Well controlled, continue prozac  3. Insomnia, unspecified type PSG negative for OSA, may continue melatonin to aid with falling asleep. Discussed avoiding naps and afternoon caffeine to help with going to sleep earlier.    General Counseling: Stephanie Ferrell understanding of the findings of todays visit and agrees with plan of treatment. I have discussed any further diagnostic evaluation that may be needed or ordered today. We also reviewed her medications today. she has been encouraged to call the office with any questions or concerns that should arise  related to todays visit.    No orders of the defined types were placed in this encounter.   No orders of the defined types were placed in this encounter.   This patient was seen by Drema Dallas, PA-C in collaboration with Dr. Clayborn Bigness as a part of collaborative care agreement.   Total time spent:30 Minutes Time spent includes review of chart, medications, test results, and follow up plan with the patient.      Dr Lavera Guise Internal medicine

## 2021-02-13 ENCOUNTER — Telehealth: Payer: Self-pay

## 2021-02-13 DIAGNOSIS — G4719 Other hypersomnia: Secondary | ICD-10-CM | POA: Insufficient documentation

## 2021-02-13 NOTE — Telephone Encounter (Signed)
Patient has requested to reschedule procedure due to family emergency. LVM to call office back.

## 2021-02-13 NOTE — Procedures (Signed)
[place] Report Part I                                                                 Phone: [phone] Fax: [phone]  Patient Name: Stephanie Ferrell, Stephanie Ferrell Acquisition Number: [id number]  Date of Birth: September 30, 1970 Acquisition Date: 02/02/2021  Referring Physician: Jonetta Osgood, NP     History: The patient is a 50 year old female who was referred for evaluation of sleep apnea. Medical History: Hypertension, depression, anxiety  Medications: Fluoxetine, fluticasone, norethindrone, tolterodine,   Procedure: This routine overnight polysomnogram was performed on the [place] 4 or 5 using the standard diagnostic protocol. This included 6 channels of EEG, 2 channels of EOG, chin EMG, bilateral anterior tibialis EMG, nasal/oral thermister, PTAF (nasal pressure transducer), chest and abdominal wall movements, EKG, pulse oximetry and EtCO2 when appropriate.  Description: The total recording time was 414.4 minutes. The total sleep time was 343.0 minutes. There were a total of 64.2 minutes of wakefulness after sleep onset for a reducedsleep efficiency of 82.8%. The latency to sleep onset was short at 7.2 minutes. The R sleep onset latency was prolonged at 376.0 minutes. Sleep parameters, as a percentage of the total sleep time, demonstrated 34.5% of sleep was in N1 sleep, 43.9% N2, 12.5% N3 and 9.0% R sleep. There were a total of 49 arousals for an arousal index of 8.6 arousals per hour of sleep that was normal.  Respiratory monitoring demonstrated mild  snoring. There were 5 apneas and hypopneas for an Apnea Hypopnea Index of 0.9 apneas and hypopneas per hour of sleep. The REM related apnea hypopnea index was 0.0/hr of REM sleep compared to a NREM AHI of 1.0/hr.  The average duration of the respiratory events was 27.1 seconds with a maximum duration of 35.5 seconds. The respiratory events were associated with peripheral oxygen desaturations on the average to 92%. The lowest  oxygen desaturation associated with a respiratory event was 89%. Additionally, the baseline oxygen saturation during wakefulness was 96%, during NREM sleep averaged 95%, and during REM sleep averaged  96%. The total duration of oxygen < 90% was 4.9 minutes and <80% was 0.0 minutes.  Cardiac monitoring- did not demonstrate transient cardiac decelerations associated with the apneas. There were no significant cardiac rhythm irregularities.   Periodic limb movement monitoring- demonstrated that there were 151 periodic limb movements for a periodic limb movement index of 26.4 periodic limb movements per hour of sleep.   Impression: This routine overnight polysomnogram did not demonstrate significant obstructive sleep apnea due to a low Apnea Hypopnea Index of 0.9 apneas and hypopneas per hour. . The respiratory events were associated with peripheral oxygen desaturations on the average to 92% with the lowest desaturation to 89%.    There was a significantly elevated periodic limb movement index of 26.4 periodic limb movements per hour of sleep.   There was a reduced sleep efficiency increased awakeningsfailure to progress into the deeper stages of sleepThese findings would appear to be due to the combination of increased upper airway resistance and periodic limb movements. Separate treatment for periodic limb movements may be suggested if clinically indicated.  Recommendations:     Would recommend weight loss in a patient with a BMI of 29.3 Under  current guidelines, this patient would not qualify for CPAP coverage due to the overall low apnea hypopnea index.     Allyne Gee, MD [facility] Diplomate ABMS Pulmonary Critical Care Medicine Sleep Medicine Electronically reviewed and digitally signed     [place] Report Part II  Phone: [phone] Fax: [phone]  Patient last name Ferrell Neck Size 14.0 in. Acquisition [id number]  Patient first name Stephanie Weight 160.0  lbs. Started 02/02/2021 at 10:49:43 PM  Birth date [birth year] Height 62.0 in. Stopped 02/03/2021 at 5:53:25 AM  Age 89 BMI 29.3 lb/in2 Duration 414.4  Study Type Adult      MStarke, RPSGT Sleep Data: Lights Out: 10:56:01 PM Sleep Onset: 11:03:13 PM  Lights On: 5:50:25 AM Sleep Efficiency: 82.8 %  Total Recording Time: 414.4 min Sleep Latency (from Lights Off) 7.2 min  Total Sleep Time (TST): 343.0 min R Latency (from Sleep Onset): 376.0 min  Sleep Period Time: 407.0 min Total number of awakenings: 36  Wake during sleep: 64.0 min Wake After Sleep Onset (WASO): 64.2 min   Sleep Data:         Arousal Summary: Stage  Latency from lights out (min) Latency from sleep onset (min) Duration (min) % Total Sleep Time  Normal values  N 1 7.2 0.0 118.5 34.5 (5%)  N 2 13.7 6.5 150.5 43.9 (50%)  N 3 234.7 227.5 43.0 12.5 (20%)  R 383.2 376.0 31.0 9.0 (25%)    Number Index  Spontaneous 36 6.3  Apneas & Hypopneas 3 0.5  RERAs 0 0.0       (Apneas & Hypopneas & RERAs)  (3) (0.5)  Limb Movement 12 2.1  Snore 0 0.0  TOTAL 51 8.9     Respiratory Data:  CA OA MA Apnea Hypopnea* A+ H RERA Total  Number 0 0 0 0 5 5 0 5  Mean Dur (sec) 0.0 0.0 0.0 0.0 27.1 27.1 0.0 27.1  Max Dur (sec) 0.0 0.0 0.0 0.0 35.5 35.5 0.0 35.5  Total Dur (min) 0.0 0.0 0.0 0.0 2.3 2.3 0.0 2.3  % of TST 0.0 0.0 0.0 0.0 0.7 0.7 0.0 0.7  Index (#/h TST) 0.0 0.0 0.0 0.0 0.9 0.9 0.0 0.9  *Hypopneas scored based on 4% or greater desaturation.  Sleep Stage:     Body Position Data:   REM NREM TST  AHI 0.0 1.0 0.9  RDI 0.0 1.0 0.9         Sleep (min) TST (%) REM (min) NREM (min) CA (#) OA (#) MA (#) HYP (#) AHI (#/h) RERA (#) RDI (#/h) Desat (#)  Supine 19.4 5.66 0.0 19.4 0 0 0 0 0.0 0 0.00 2  Non-Supine 323.60 94.34 31.00 292.60 0.00 0.00 0.00 5.00 0.93 0 0.93 33.00  Left: 118.5 34.55 0.0 118.5 0 0 0 4 2.0 0 2.0 17  Right: 205.1 59.80 31.0 174.1 0 0 0 1 0.3 0 0.3 16       Snoring: Total number of snoring  episodes  0  Total time with snoring    min (   % of sleep)   Oximetry Distribution:             WK REM NREM TOTAL  Average (%)   96 96 95 95  < 90% 0.1 0.0 4.8 4.9  < 80% 0.0 0.0 0.0 0.0  < 70% 0.0 0.0 0.0 0.0  # of Desaturations* '1 6 28 '$ 35  Desat Index (#/hour) 0.9 11.6 5.4 6.2  Desat Max (%) '4 4 5 5  '$ Desat Max Dur (sec) 7.0 65.0 79.0 79.0  Approx Min O2 during sleep 86  Approx min O2 during a respiratory event 89  Was Oxygen added (Y/N) and final rate No:   0 LPM  *Desaturations based on 3% or greater drop from baseline.    Cheyne Stokes Breathing  WK REM NREM  Number 0 0 1  Longest Event (sec.)       11.5  Total Duration       11.5    Hypoventilation: None Present    Heart Rate Summary:  Average Heart Rate During Sleep 72.1 bpm      Highest Heart Rate During Sleep (95th %) 79.0 bpm      Highest Heart Rate During Sleep 109 bpm      Highest Heart Rate During Recording (TIB) 119 bpm       Heart Rate Observations: Event Type # Events   Bradycardia 0 Lowest HR Scored: N/A  Sinus Tachycardia During Sleep 0 Highest HR Scored: N/A  Narrow Complex Tachycardia 0 Highest HR Scored: N/A  Wide Complex Tachycardia 0 Highest HR Scored: N/A  Asystole 0 Longest Pause: N/A  Atrial Fibrillation 0 Duration Longest Event: N/A  Other Arrythmias  No Type:    Periodic Limb Movement Data: (Primary legs unless otherwise noted) Total # Limb Movement 162 Limb Movement Index 28.3  Total # PLMS 151 PLMS Index 26.4  Total # PLMS Arousals 8 PLMS Arousal Index 1.4  Percentage Sleep Time with PLMS 86.23mn (25.2 % sleep)  Mean Duration limb movements (secs) 518.6

## 2021-03-01 ENCOUNTER — Encounter: Payer: Self-pay | Admitting: Gastroenterology

## 2021-03-01 ENCOUNTER — Encounter
Admission: RE | Disposition: A | Discharge status: Home / Self Care | Payer: Self-pay | Source: Home / Self Care | Attending: Gastroenterology

## 2021-03-01 ENCOUNTER — Ambulatory Visit: Payer: BC Managed Care – PPO | Admitting: Anesthesiology

## 2021-03-01 ENCOUNTER — Ambulatory Visit
Admission: RE | Admit: 2021-03-01 | Discharge: 2021-03-01 | Disposition: A | Discharge status: Home / Self Care | Payer: BC Managed Care – PPO | Attending: Gastroenterology | Admitting: Gastroenterology

## 2021-03-01 DIAGNOSIS — K644 Residual hemorrhoidal skin tags: Secondary | ICD-10-CM | POA: Insufficient documentation

## 2021-03-01 DIAGNOSIS — Z809 Family history of malignant neoplasm, unspecified: Secondary | ICD-10-CM | POA: Diagnosis not present

## 2021-03-01 DIAGNOSIS — K635 Polyp of colon: Secondary | ICD-10-CM | POA: Diagnosis not present

## 2021-03-01 DIAGNOSIS — Z79899 Other long term (current) drug therapy: Secondary | ICD-10-CM | POA: Insufficient documentation

## 2021-03-01 DIAGNOSIS — D122 Benign neoplasm of ascending colon: Secondary | ICD-10-CM | POA: Insufficient documentation

## 2021-03-01 DIAGNOSIS — D120 Benign neoplasm of cecum: Secondary | ICD-10-CM | POA: Insufficient documentation

## 2021-03-01 DIAGNOSIS — Z1211 Encounter for screening for malignant neoplasm of colon: Secondary | ICD-10-CM | POA: Insufficient documentation

## 2021-03-01 DIAGNOSIS — Z800 Family history of malignant neoplasm of digestive organs: Secondary | ICD-10-CM | POA: Diagnosis not present

## 2021-03-01 DIAGNOSIS — Z833 Family history of diabetes mellitus: Secondary | ICD-10-CM | POA: Diagnosis not present

## 2021-03-01 DIAGNOSIS — Z8249 Family history of ischemic heart disease and other diseases of the circulatory system: Secondary | ICD-10-CM | POA: Insufficient documentation

## 2021-03-01 HISTORY — PX: COLONOSCOPY WITH PROPOFOL: SHX5780

## 2021-03-01 LAB — POCT PREGNANCY, URINE: Preg Test, Ur: NEGATIVE

## 2021-03-01 SURGERY — COLONOSCOPY WITH PROPOFOL
Anesthesia: General

## 2021-03-01 MED ORDER — PROPOFOL 10 MG/ML IV BOLUS
INTRAVENOUS | Status: DC | PRN
  Administered 2021-03-01: 30 mg via INTRAVENOUS
  Administered 2021-03-01: 80 mg via INTRAVENOUS
  Administered 2021-03-01: 20 mg via INTRAVENOUS

## 2021-03-01 MED ORDER — DEXMEDETOMIDINE (PRECEDEX) IN NS 20 MCG/5ML (4 MCG/ML) IV SYRINGE
PREFILLED_SYRINGE | INTRAVENOUS | Status: DC | PRN
  Administered 2021-03-01: 8 ug via INTRAVENOUS

## 2021-03-01 MED ORDER — SODIUM CHLORIDE 0.9 % IV SOLN: INTRAVENOUS | Status: DC

## 2021-03-01 MED ORDER — PROPOFOL 500 MG/50ML IV EMUL
INTRAVENOUS | Status: DC | PRN
  Administered 2021-03-01: 140 ug/kg/min via INTRAVENOUS

## 2021-03-01 NOTE — H&P (Signed)
Vonda Antigua, MD [address], [place], [place], [place], [address] [address], [place], [place], [place], [address] Phone: [phone]  Fax: [phone]  Primary Care Physician:  Mylinda Latina, PA-C   Pre-Procedure History & Physical: HPI:  Stephanie Ferrell is a 50 y.o. female is here for a colonoscopy.   Past Medical History:  Diagnosis Date   Hypertension     Past Surgical History:  Procedure Laterality Date   CESAREAN SECTION      Prior to Admission medications   Medication Sig Start Date End Date Taking? Authorizing Provider  amLODipine (NORVASC) 5 MG tablet TAKE 1 TABLET BY MOUTH DAILY. 01/12/21  Yes Abernathy, Yetta Flock, NP  FLUoxetine (PROZAC) 40 MG capsule TAKE 1 CAPSULE BY MOUTH EVERY DAY 05/04/20  Yes Gae Dry, MD  norethindrone (MICRONOR) 0.35 MG tablet Take 1 tablet (0.35 mg total) by mouth daily. 05/04/20  Yes Gae Dry, MD  tolterodine (DETROL LA) 4 MG 24 hr capsule Take 1 capsule (4 mg total) by mouth daily. 05/04/20  Yes Gae Dry, MD  fluticasone (FLONASE) 50 MCG/ACT nasal spray Place 2 sprays into both nostrils daily. 03/02/20   Ronnell Freshwater, NP    Allergies as of 01/16/2021   (No Known Allergies)    Family History  Problem Relation Age of Onset   Diabetes Maternal Uncle    Colon cancer Maternal Grandfather    Cancer Paternal Grandfather    Anxiety disorder Mother    Depression Mother    Hypertension Father     Social History   Socioeconomic History   Marital status: Married    Spouse name: Not on file   Number of children: Not on file   Years of education: Not on file   Highest education level: Not on file  Occupational History   Not on file  Tobacco Use   Smoking status: Never   Smokeless tobacco: Never  Vaping Use   Vaping Use: Never used  Substance and Sexual Activity   Alcohol use: Yes    Comment: ocassionally    Drug use: No   Sexual activity: Yes    Birth control/protection: Pill   Other Topics Concern   Not on file  Social History Narrative   Not on file   Social Determinants of Health   Financial Resource Strain: Not on file  Food Insecurity: Not on file  Transportation Needs: Not on file  Physical Activity: Not on file  Stress: Not on file  Social Connections: Not on file  Intimate Partner Violence: Not on file    Review of Systems: See HPI, otherwise negative ROS  Physical Exam: Constitutional: General:   Alert,  Well-developed, well-nourished, pleasant and cooperative in NAD BP 139/80   Pulse 82   Temp 98.2 F (36.8 C)   Resp 16   Ht '5\' 2"'$  (1.575 m)   Wt 70.5 kg   SpO2 100%   BMI 28.41 kg/m   Head: Normocephalic, atraumatic.   Eyes:  Sclera clear, no icterus.   Conjunctiva pink.   Mouth:  No deformity or lesions, oropharynx pink & moist.  Neck:  Supple, trachea midline  Respiratory: Normal respiratory effort  Gastrointestinal:  Soft, non-tender and non-distended without masses, hepatosplenomegaly or hernias noted.  No guarding or rebound tenderness.     Cardiac: No clubbing or edema.  No cyanosis. Normal posterior tibial pedal pulses noted.  Lymphatic:  No significant cervical adenopathy.  Psych:  Alert and cooperative. Normal mood and  affect.  Musculoskeletal:   Symmetrical without gross deformities. 5/5 Lower extremity strength bilaterally.  Skin: Warm. Intact without significant lesions or rashes. No jaundice.  Neurologic:  Face symmetrical, tongue midline, Normal sensation to touch;  grossly normal neurologically.  Psych:  Alert and oriented x3, Alert and cooperative. Normal mood and affect.  Impression/Plan: Stephanie Ferrell is here for a colonoscopy to be performed for average risk screening. Reports history of colon cancer in one grandparent. No cancer in anyone else in the family including First degree relatives  Risks, benefits, limitations, and alternatives regarding  colonoscopy have been reviewed with the  patient.  Questions have been answered.  All parties agreeable.   Virgel Manifold, MD  03/01/2021, 9:52 AM

## 2021-03-01 NOTE — Anesthesia Postprocedure Evaluation (Signed)
Anesthesia Post Note  Patient: Stephanie Ferrell  Procedure(s) Performed: COLONOSCOPY WITH PROPOFOL  Patient location during evaluation: Endoscopy Anesthesia Type: General Level of consciousness: awake and alert and oriented Pain management: pain level controlled Vital Signs Assessment: post-procedure vital signs reviewed and stable Respiratory status: spontaneous breathing, nonlabored ventilation and respiratory function stable Cardiovascular status: blood pressure returned to baseline and stable Postop Assessment: no signs of nausea or vomiting Anesthetic complications: no   No notable events documented.   Last Vitals:  Vitals:   03/01/21 1027 03/01/21 1047  BP: (!) 103/54 132/72  Pulse: 62   Resp: 13   Temp: (!) 36.4 C   SpO2: 97%     Last Pain:  Vitals:   03/01/21 1047  TempSrc:   PainSc: 0-No pain                 Lumen Brinlee

## 2021-03-01 NOTE — Anesthesia Preprocedure Evaluation (Signed)
Anesthesia Evaluation  Patient identified by MRN, date of birth, ID band Patient awake    Reviewed: Allergy & Precautions, NPO status , Patient's Chart, lab work & pertinent test results  History of Anesthesia Complications Negative for: history of anesthetic complications  Airway Mallampati: II  TM Distance: >3 FB Neck ROM: Full    Dental no notable dental hx.    Pulmonary neg pulmonary ROS, neg sleep apnea, neg COPD,    breath sounds clear to auscultation- rhonchi (-) wheezing      Cardiovascular Exercise Tolerance: Good hypertension, Pt. on medications (-) CAD, (-) Past MI, (-) Cardiac Stents and (-) CABG  Rhythm:Regular Rate:Normal - Systolic murmurs and - Diastolic murmurs    Neuro/Psych neg Seizures negative neurological ROS  negative psych ROS   GI/Hepatic negative GI ROS, Neg liver ROS,   Endo/Other  negative endocrine ROSneg diabetes  Renal/GU negative Renal ROS     Musculoskeletal negative musculoskeletal ROS (+)   Abdominal (+) - obese,   Peds  Hematology negative hematology ROS (+)   Anesthesia Other Findings Past Medical History: No date: Hypertension   Reproductive/Obstetrics                             Anesthesia Physical Anesthesia Plan  ASA: 2  Anesthesia Plan: General   Post-op Pain Management:    Induction: Intravenous  PONV Risk Score and Plan: 2 and Propofol infusion  Airway Management Planned: Natural Airway  Additional Equipment:   Intra-op Plan:   Post-operative Plan:   Informed Consent: I have reviewed the patients History and Physical, chart, labs and discussed the procedure including the risks, benefits and alternatives for the proposed anesthesia with the patient or authorized representative who has indicated his/her understanding and acceptance.     Dental advisory given  Plan Discussed with: CRNA and Anesthesiologist  Anesthesia Plan  Comments:         Anesthesia Quick Evaluation

## 2021-03-01 NOTE — Transfer of Care (Signed)
Immediate Anesthesia Transfer of Care Note  Patient: LYNELL BENZIE  Procedure(s) Performed: COLONOSCOPY WITH PROPOFOL  Patient Location: PACU  Anesthesia Type:General  Level of Consciousness: sedated  Airway & Oxygen Therapy: Patient Spontanous Breathing  Post-op Assessment: Report given to RN and Post -op Vital signs reviewed and stable  Post vital signs: Reviewed and stable  Last Vitals:  Vitals Value Taken Time  BP 103/54 03/01/21 1028  Temp    Pulse 63 03/01/21 1028  Resp 14 03/01/21 1028  SpO2 97 % 03/01/21 1028  Vitals shown include unvalidated device data.  Last Pain:  Vitals:   03/01/21 0900  PainSc: 0-No pain         Complications: No notable events documented.

## 2021-03-01 NOTE — Op Note (Addendum)
[facility] Gastroenterology Patient Name: Stephanie Ferrell Procedure Date: 03/01/2021 9:50 AM MRN: [id number] Account #: [id number] Date of Birth: Oct 04, 1970 Admit Type: Outpatient Age: 50 Room: [facility] ENDO ROOM 3 Gender: Female Note Status: Finalized Procedure:             Colonoscopy Indications:           Screening for colorectal malignant neoplasm Providers:             Quintara Bost B. Bonna Gains MD, MD Medicines:             Monitored Anesthesia Care Complications:         No immediate complications. Procedure:             Pre-Anesthesia Assessment:                        - ASA Grade Assessment: II - A patient with mild                         systemic disease.                        - Prior to the procedure, a History and Physical was                         performed, and patient medications, allergies and                         sensitivities were reviewed. The patient's tolerance                         of previous anesthesia was reviewed.                        - The risks and benefits of the procedure and the                         sedation options and risks were discussed with the                         patient. All questions were answered and informed                         consent was obtained.                        - Patient identification and proposed procedure were                         verified prior to the procedure by the physician, the                         nurse, the anesthesiologist, the anesthetist and the                         technician. The procedure was verified in the                         procedure room.  After obtaining informed consent, the colonoscope was                         passed under direct vision. Throughout the procedure,                         the patient's blood pressure, pulse, and oxygen                         saturations were monitored continuously. The                         Colonoscope  was introduced through the anus and                         advanced to the the cecum, identified by appendiceal                         orifice and ileocecal valve. The colonoscopy was                         performed with ease. The patient tolerated the                         procedure well. The quality of the bowel preparation                         was good. Findings:      The perianal and digital rectal examinations were normal.      Two sessile polyps were found in the ascending colon and cecum. The       polyps were 5 mm in size. These polyps were removed with a cold snare.       Resection and retrieval were complete.      The exam was otherwise without abnormality.      The rectum, sigmoid colon, descending colon, transverse colon, ascending       colon and cecum appeared normal.      Anal papilla(e) were hypertrophied.      No additional abnormalities were found on retroflexion. Impression:            - Two 5 mm polyps in the ascending colon and in the                         cecum, removed with a cold snare. Resected and                         retrieved.                        - The examination was otherwise normal.                        - The rectum, sigmoid colon, descending colon,                         transverse colon, ascending colon and cecum are normal.                        - Anal papilla(e) were  hypertrophied. Recommendation:        - Discharge patient to home (with escort).                        - Advance diet as tolerated.                        - Continue present medications.                        - Await pathology results.                        - Repeat colonoscopy date to be determined after                         pending pathology results are reviewed.                        - The findings and recommendations were discussed with                         the patient.                        - The findings and recommendations were discussed with                          the patient's family.                        - Return to primary care physician as previously                         scheduled. Procedure Code(s):     --- Professional ---                        [phone], Colonoscopy, flexible; with removal of                         tumor(s), polyp(s), or other lesion(s) by snare                         technique Diagnosis Code(s):     --- Professional ---                        Z12.11, Encounter for screening for malignant neoplasm                         of colon                        K63.5, Polyp of colon CPT copyright 2019 American Medical Association. All rights reserved. The codes documented in this report are preliminary and upon coder review may  be revised to meet current compliance requirements.  Vonda Antigua, MD Margretta Sidle B. Bonna Gains MD, MD 03/01/2021 10:30:55 AM This report has been signed electronically. Number of Addenda: 0 Note Initiated On: 03/01/2021 9:50 AM Scope Withdrawal Time: 0 hours 17 minutes 56 seconds  Total Procedure Duration: 0 hours 22 minutes 22 seconds  Estimated Blood Loss:  Estimated blood  loss: none.      [facility]

## 2021-03-02 ENCOUNTER — Encounter: Payer: Self-pay | Admitting: Gastroenterology

## 2021-03-02 LAB — SURGICAL PATHOLOGY

## 2021-03-03 ENCOUNTER — Encounter: Payer: Self-pay | Admitting: Gastroenterology

## 2021-06-02 ENCOUNTER — Other Ambulatory Visit: Payer: Self-pay | Admitting: Obstetrics & Gynecology

## 2021-06-02 DIAGNOSIS — Z3041 Encounter for surveillance of contraceptive pills: Secondary | ICD-10-CM

## 2021-06-12 ENCOUNTER — Encounter: Payer: Self-pay | Admitting: Obstetrics & Gynecology

## 2021-06-12 ENCOUNTER — Other Ambulatory Visit (HOSPITAL_COMMUNITY)
Admission: RE | Admit: 2021-06-12 | Discharge: 2021-06-12 | Disposition: A | Discharge status: Home / Self Care | Payer: BC Managed Care – PPO | Source: Ambulatory Visit | Attending: Obstetrics & Gynecology | Admitting: Obstetrics & Gynecology

## 2021-06-12 ENCOUNTER — Other Ambulatory Visit: Payer: Self-pay

## 2021-06-12 ENCOUNTER — Ambulatory Visit (INDEPENDENT_AMBULATORY_CARE_PROVIDER_SITE_OTHER): Payer: BC Managed Care – PPO | Admitting: Obstetrics & Gynecology

## 2021-06-12 VITALS — BP 120/80 | Ht 62.0 in | Wt 159.0 lb

## 2021-06-12 DIAGNOSIS — Z3041 Encounter for surveillance of contraceptive pills: Secondary | ICD-10-CM

## 2021-06-12 DIAGNOSIS — Z01419 Encounter for gynecological examination (general) (routine) without abnormal findings: Secondary | ICD-10-CM | POA: Diagnosis not present

## 2021-06-12 DIAGNOSIS — Z124 Encounter for screening for malignant neoplasm of cervix: Secondary | ICD-10-CM

## 2021-06-12 DIAGNOSIS — Z1231 Encounter for screening mammogram for malignant neoplasm of breast: Secondary | ICD-10-CM

## 2021-06-12 MED ORDER — TOLTERODINE TARTRATE ER 4 MG PO CP24: 4.0000 mg | ORAL_CAPSULE | Freq: Every day | ORAL | 3 refills | Status: AC

## 2021-06-12 MED ORDER — NORETHINDRONE 0.35 MG PO TABS: 1.0000 | ORAL_TABLET | Freq: Every day | ORAL | 3 refills | Status: AC

## 2021-06-12 MED ORDER — FLUOXETINE HCL 40 MG PO CAPS: ORAL_CAPSULE | ORAL | 3 refills | Status: DC

## 2021-06-12 NOTE — Progress Notes (Signed)
HPI:      [name] is a 50 y.o. [phone] who LMP was Patient's last menstrual period was 05/24/2021., she presents today for her annual examination. The patient has no complaints today. The patient is sexually active. Her last pap: approximate date 2019 and was normal and last mammogram: approximate date 2021 and was normal. The patient does perform self breast exams.  There is no notable family history of breast or ovarian cancer in her family.  The patient has regular exercise: yes.  The patient denies current symptoms of depression.    GYN History: Contraception: oral progesterone-only contraceptive  PMHx: Past Medical History:  Diagnosis Date   Hypertension    Past Surgical History:  Procedure Laterality Date   CESAREAN SECTION     COLONOSCOPY WITH PROPOFOL N/A 03/01/2021   Procedure: COLONOSCOPY WITH PROPOFOL;  Surgeon: Virgel Manifold, MD;  Location: [facility] ENDOSCOPY;  Service: Endoscopy;  Laterality: N/A;   Family History  Problem Relation Age of Onset   Diabetes Maternal Uncle    Colon cancer Maternal Grandfather    Cancer Paternal Grandfather    Anxiety disorder Mother    Depression Mother    Hypertension Father    Social History   Tobacco Use   Smoking status: Never   Smokeless tobacco: Never  Vaping Use   Vaping Use: Never used  Substance Use Topics   Alcohol use: Yes    Comment: ocassionally    Drug use: No    Current Outpatient Medications:    amLODipine (NORVASC) 5 MG tablet, TAKE 1 TABLET BY MOUTH DAILY., Disp: 90 tablet, Rfl: 1   FLUoxetine (PROZAC) 40 MG capsule, TAKE 1 CAPSULE BY MOUTH EVERY DAY, Disp: 90 capsule, Rfl: 3   fluticasone (FLONASE) 50 MCG/ACT nasal spray, Place 2 sprays into both nostrils daily., Disp: 16 g, Rfl: 6   norethindrone (MICRONOR) 0.35 MG tablet, TAKE 1 TABLET BY MOUTH ONCE DAILY, Disp: 84 tablet, Rfl: 3   tolterodine (DETROL LA) 4 MG 24 hr capsule, Take 1 capsule (4 mg total) by mouth daily., Disp: 90 capsule,  Rfl: 3 Allergies: Patient has no known allergies.  Review of Systems  Constitutional:  Positive for malaise/fatigue. Negative for chills and fever.  HENT:  Negative for congestion, sinus pain and sore throat.   Eyes:  Negative for blurred vision and pain.  Respiratory:  Negative for cough and wheezing.   Cardiovascular:  Negative for chest pain and leg swelling.  Gastrointestinal:  Negative for abdominal pain, constipation, diarrhea, heartburn, nausea and vomiting.  Genitourinary:  Positive for frequency. Negative for dysuria, hematuria and urgency.  Musculoskeletal:  Negative for back pain, joint pain, myalgias and neck pain.  Skin:  Negative for itching and rash.  Neurological:  Negative for dizziness, tremors and weakness.  Endo/Heme/Allergies:  Does not bruise/bleed easily.  Psychiatric/Behavioral:  Negative for depression. The patient is not nervous/anxious and does not have insomnia.    Objective: BP 120/80   Ht 5\' 2"  (1.575 m)   Wt 159 lb (72.1 kg)   LMP 05/24/2021   BMI 29.08 kg/m   Filed Weights   06/12/21 1531  Weight: 159 lb (72.1 kg)   Body mass index is 29.08 kg/m. Physical Exam Constitutional:      General: She is not in acute distress.    Appearance: She is well-developed.  Genitourinary:     Bladder, rectum and urethral meatus normal.     No lesions in the vagina.  Right Labia: No rash, tenderness or lesions.    Left Labia: No tenderness, lesions or rash.    No vaginal bleeding.      Right Adnexa: not tender and no mass present.    Left Adnexa: not tender and no mass present.    No cervical motion tenderness, friability, lesion or polyp.     Uterus is not enlarged.     No uterine mass detected.    Pelvic exam was performed with patient in the lithotomy position.  Breasts:    Right: No mass, skin change or tenderness.     Left: No mass, skin change or tenderness.  HENT:     Head: Normocephalic and atraumatic. No laceration.     Right Ear: Hearing  normal.     Left Ear: Hearing normal.     Mouth/Throat:     Pharynx: Uvula midline.  Eyes:     Pupils: Pupils are equal, round, and reactive to light.  Neck:     Thyroid: No thyromegaly.  Cardiovascular:     Rate and Rhythm: Normal rate and regular rhythm.     Heart sounds: No murmur heard.   No friction rub. No gallop.  Pulmonary:     Effort: Pulmonary effort is normal. No respiratory distress.     Breath sounds: Normal breath sounds. No wheezing.  Abdominal:     General: Bowel sounds are normal. There is no distension.     Palpations: Abdomen is soft.     Tenderness: There is no abdominal tenderness. There is no rebound.  Musculoskeletal:        General: Normal range of motion.     Cervical back: Normal range of motion and neck supple.  Neurological:     Mental Status: She is alert and oriented to person, place, and time.     Cranial Nerves: No cranial nerve deficit.  Skin:    General: Skin is warm and dry.  Psychiatric:        Judgment: Judgment normal.  Vitals reviewed.    Assessment:  ANNUAL EXAM 1. Women's annual routine gynecological examination   2. Encounter for surveillance of contraceptive pills   3. Encounter for screening mammogram for malignant neoplasm of breast   4. Screening for cervical cancer      Screening Plan:            1.  Cervical Screening-  Pap smear done today  2. Breast screening- Exam annually and mammogram>40 planned   3. Colonoscopy  done 2022, every 10 years  4. Labs managed by PCP  5. Counseling for contraception: oral progesterone-only contraceptive  The pregnancy intention screening data noted above was reviewed. Potential methods of contraception were discussed. The patient elected to proceed with Oral Contraceptive.   Renew Detrol and Prozac, no changes this year    F/U  Return in about 1 year (around 06/12/2022) for Annual.  Barnett Applebaum, MD, [facility], [facility] 06/12/2021  4:06 PM

## 2021-06-12 NOTE — Patient Instructions (Signed)
PAP every three years Mammogram every year    Call [phone] to schedule at [facility] Colonoscopy every 10 years Labs yearly (with PCP)  Thank you for choosing [facility]. As part of our ongoing efforts to improve patient experience, we would appreciate your feedback. Please fill out the short survey that you will receive by mail or MyChart. Your opinion is important to us! - Dr. Suraya Vidrine  Recommendations to boost your immunity to prevent illness such as viral flu and colds, including covid19, are as follows:       - - -  Vitamin K2 and Vitamin D3  - - - Take Vitamin K2 at 200-300 mcg daily (usually 2-3 pills daily of the over the counter formulation). Take Vitamin D3 at 3000-4000 U daily (usually 3-4 pills daily of the over the counter formulation). Studies show that these two at high normal levels in your system are very effective in keeping your immunity so strong and protective that you will be unlikely to contract viral illness such as those listed above.  Dr Marialuisa Basara  

## 2021-06-19 ENCOUNTER — Other Ambulatory Visit: Payer: Self-pay | Admitting: Obstetrics & Gynecology

## 2021-06-19 LAB — CYTOLOGY - PAP
Comment: NEGATIVE
Diagnosis: UNDETERMINED — AB
High risk HPV: NEGATIVE

## 2021-07-12 ENCOUNTER — Ambulatory Visit
Admission: RE | Admit: 2021-07-12 | Discharge: 2021-07-12 | Disposition: A | Discharge status: Home / Self Care | Payer: BC Managed Care – PPO | Source: Ambulatory Visit | Attending: Obstetrics & Gynecology | Admitting: Obstetrics & Gynecology

## 2021-07-12 ENCOUNTER — Other Ambulatory Visit: Payer: Self-pay | Admitting: Obstetrics & Gynecology

## 2021-07-12 ENCOUNTER — Other Ambulatory Visit: Payer: Self-pay

## 2021-07-12 DIAGNOSIS — Z1231 Encounter for screening mammogram for malignant neoplasm of breast: Secondary | ICD-10-CM | POA: Insufficient documentation

## 2021-07-12 DIAGNOSIS — R928 Other abnormal and inconclusive findings on diagnostic imaging of breast: Secondary | ICD-10-CM

## 2021-07-12 NOTE — Progress Notes (Signed)
Let her know someone will be calling to schedule additional left breast mammogram and ultrasound based on her results:  The results of your recent mammogram reveals an area that needs further magnification views to determine if there is any concern or if it is just a false alarm. There is no suggestion of cancer, just a need for further images and evaluation by the radiologist. They should be contacting you, if not already, to schedule these additional mammogram pictures. If you have any questions, or if they have not yet contacted you, then please give [place] a call at [phone], so that we may help in this process. I know this seems worrisome, yet usually additional xrays clear up any suspicion for breast cancer.

## 2021-07-14 ENCOUNTER — Telehealth: Payer: Self-pay

## 2021-07-14 ENCOUNTER — Ambulatory Visit: Payer: BC Managed Care – PPO | Admitting: Nurse Practitioner

## 2021-07-14 NOTE — Telephone Encounter (Signed)
-----   Message from Gae Dry, MD sent at 07/12/2021  3:02 PM EST ----- Let her know someone will be calling to schedule additional left breast mammogram and ultrasound based on her results:  The results of your recent mammogram reveals an area that needs further magnification views to determine if there is any concern or if it is just a false alarm. There is no suggestion of cancer, just a need for further images and evaluation by the ra diologist. They should be contacting you, if not already, to schedule these additional mammogram pictures. If you have any questions, or if they have not yet contacted you, then please give [place] a call at [phone], so that we may help in this process.  I know this seems worrisome, yet usually additional xrays clear up any suspicion for breast cancer.

## 2021-07-14 NOTE — Telephone Encounter (Signed)
LM with pt.

## 2021-07-28 ENCOUNTER — Other Ambulatory Visit: Payer: Self-pay

## 2021-07-28 ENCOUNTER — Ambulatory Visit
Admission: RE | Admit: 2021-07-28 | Discharge: 2021-07-28 | Disposition: A | Discharge status: Home / Self Care | Payer: BC Managed Care – PPO | Source: Ambulatory Visit | Attending: Obstetrics & Gynecology | Admitting: Obstetrics & Gynecology

## 2021-07-28 DIAGNOSIS — R928 Other abnormal and inconclusive findings on diagnostic imaging of breast: Secondary | ICD-10-CM

## 2021-07-29 ENCOUNTER — Encounter: Payer: Self-pay | Admitting: Obstetrics & Gynecology

## 2021-10-25 ENCOUNTER — Other Ambulatory Visit: Payer: Self-pay | Admitting: Nurse Practitioner

## 2021-10-25 DIAGNOSIS — I10 Essential (primary) hypertension: Secondary | ICD-10-CM

## 2021-12-05 ENCOUNTER — Other Ambulatory Visit: Payer: Self-pay | Admitting: Nurse Practitioner

## 2021-12-05 DIAGNOSIS — I10 Essential (primary) hypertension: Secondary | ICD-10-CM

## 2021-12-05 NOTE — Telephone Encounter (Signed)
Pt needs appt for further refills. 

## 2021-12-05 NOTE — Telephone Encounter (Signed)
Please call patient and make appt, we are sending 30 days of medications to pharmacy ?

## 2021-12-11 ENCOUNTER — Telehealth: Payer: Self-pay

## 2021-12-11 NOTE — Telephone Encounter (Signed)
We already send for 30 days

## 2021-12-11 NOTE — Telephone Encounter (Signed)
Error

## 2021-12-28 ENCOUNTER — Encounter: Payer: Self-pay | Admitting: Physician Assistant

## 2021-12-28 ENCOUNTER — Ambulatory Visit (INDEPENDENT_AMBULATORY_CARE_PROVIDER_SITE_OTHER): Payer: BC Managed Care – PPO | Admitting: Physician Assistant

## 2021-12-28 DIAGNOSIS — R5383 Other fatigue: Secondary | ICD-10-CM

## 2021-12-28 DIAGNOSIS — Z23 Encounter for immunization: Secondary | ICD-10-CM

## 2021-12-28 DIAGNOSIS — E538 Deficiency of other specified B group vitamins: Secondary | ICD-10-CM

## 2021-12-28 DIAGNOSIS — I10 Essential (primary) hypertension: Secondary | ICD-10-CM

## 2021-12-28 DIAGNOSIS — F411 Generalized anxiety disorder: Secondary | ICD-10-CM | POA: Diagnosis not present

## 2021-12-28 DIAGNOSIS — E559 Vitamin D deficiency, unspecified: Secondary | ICD-10-CM

## 2021-12-28 DIAGNOSIS — E782 Mixed hyperlipidemia: Secondary | ICD-10-CM

## 2021-12-28 DIAGNOSIS — R7989 Other specified abnormal findings of blood chemistry: Secondary | ICD-10-CM

## 2021-12-28 MED ORDER — ZOSTER VAC RECOMB ADJUVANTED 50 MCG/0.5ML IM SUSR: 0.5000 mL | Freq: Once | INTRAMUSCULAR | 0 refills | Status: AC

## 2021-12-28 MED ORDER — AMLODIPINE BESYLATE 5 MG PO TABS: ORAL_TABLET | ORAL | 1 refills | Status: DC

## 2021-12-28 NOTE — Progress Notes (Signed)
[facility] [place], [place] [address]  Internal MEDICINE  Office Visit Note  Patient Name: Stephanie Ferrell  [id number]  [id number]  Date of Service: 01/09/2022  Chief Complaint  Patient presents with   Follow-up   Hypertension   Medication Refill    HPI Pt is here for routine follow up for med refills and has no complaints today -BP has been stable, needs amlodipine refilled -Followed by OBGYN who prescribes her prozac, tolterodine, and norethindrone -due for shingles vaccine -Due for routine fasting labs prior to CPE -colonoscopy done last year  Current Medication: Outpatient Encounter Medications as of 12/28/2021  Medication Sig   FLUoxetine (PROZAC) 40 MG capsule TAKE 1 CAPSULE BY MOUTH EVERY DAY   fluticasone (FLONASE) 50 MCG/ACT nasal spray Place 2 sprays into both nostrils daily.   norethindrone (MICRONOR) 0.35 MG tablet Take 1 tablet (0.35 mg total) by mouth daily.   tolterodine (DETROL LA) 4 MG 24 hr capsule Take 1 capsule (4 mg total) by mouth daily.   [DISCONTINUED] amLODipine (NORVASC) 5 MG tablet TAKE 1 TABLET BY MOUTH DAILY. *MUST HAVE APPOINTMENT FOR NEXT REFILL*   [DISCONTINUED] Zoster Vaccine Adjuvanted [facility]) injection Inject 0.5 mLs into the muscle once.   amLODipine (NORVASC) 5 MG tablet TAKE 1 TABLET BY MOUTH DAILY.   [EXPIRED] Zoster Vaccine Adjuvanted [facility]) injection Inject 0.5 mLs into the muscle once for 1 dose.   No facility-administered encounter medications on file as of 12/28/2021.    Surgical History: Past Surgical History:  Procedure Laterality Date   CESAREAN SECTION     COLONOSCOPY WITH PROPOFOL N/A 03/01/2021   Procedure: COLONOSCOPY WITH PROPOFOL;  Surgeon: Virgel Manifold, MD;  Location: [facility] ENDOSCOPY;  Service: Endoscopy;  Laterality: N/A;    Medical History: Past Medical History:  Diagnosis Date   Hypertension     Family History: Family History  Problem Relation Age of Onset   Diabetes  Maternal Uncle    Colon cancer Maternal Grandfather    Cancer Paternal Grandfather    Anxiety disorder Mother    Depression Mother    Hypertension Father     Social History   Socioeconomic History   Marital status: Married    Spouse name: Not on file   Number of children: Not on file   Years of education: Not on file   Highest education level: Not on file  Occupational History   Not on file  Tobacco Use   Smoking status: Never   Smokeless tobacco: Never  Vaping Use   Vaping Use: Never used  Substance and Sexual Activity   Alcohol use: Yes    Comment: ocassionally    Drug use: No   Sexual activity: Yes    Birth control/protection: Pill  Other Topics Concern   Not on file  Social History Narrative   Not on file   Social Determinants of Health   Financial Resource Strain: Not on file  Food Insecurity: Not on file  Transportation Needs: Not on file  Physical Activity: Not on file  Stress: Not on file  Social Connections: Not on file  Intimate Partner Violence: Not on file      Review of Systems  Constitutional:  Negative for chills, fatigue and unexpected weight change.  HENT:  Negative for congestion, postnasal drip, rhinorrhea, sneezing and sore throat.   Eyes:  Negative for redness.  Respiratory:  Negative for cough, chest tightness and shortness of breath.   Cardiovascular:  Negative for chest pain  and palpitations.  Gastrointestinal:  Negative for abdominal pain, constipation, diarrhea, nausea and vomiting.  Genitourinary:  Negative for dysuria and frequency.  Musculoskeletal:  Negative for arthralgias, back pain, joint swelling and neck pain.  Skin:  Negative for rash.  Neurological: Negative.  Negative for tremors and numbness.  Hematological:  Negative for adenopathy. Does not bruise/bleed easily.  Psychiatric/Behavioral:  Negative for behavioral problems (Depression), sleep disturbance and suicidal ideas. The patient is not nervous/anxious.     Vital  Signs: BP 124/75   Pulse 77   Temp 98.9 F (37.2 C)   Resp 16   Ht '5\' 2"'$  (1.575 m)   Wt 158 lb 12.8 oz (72 kg)   SpO2 99%   BMI 29.04 kg/m    Physical Exam Vitals and nursing note reviewed.  Constitutional:      General: She is not in acute distress.    Appearance: She is well-developed. She is not diaphoretic.  HENT:     Head: Normocephalic and atraumatic.     Mouth/Throat:     Pharynx: No oropharyngeal exudate.  Eyes:     Pupils: Pupils are equal, round, and reactive to light.  Neck:     Thyroid: No thyromegaly.     Vascular: No JVD.     Trachea: No tracheal deviation.  Cardiovascular:     Rate and Rhythm: Normal rate and regular rhythm.     Heart sounds: Normal heart sounds. No murmur heard.    No friction rub. No gallop.  Pulmonary:     Effort: Pulmonary effort is normal. No respiratory distress.     Breath sounds: No wheezing or rales.  Chest:     Chest wall: No tenderness.  Abdominal:     General: Bowel sounds are normal.     Palpations: Abdomen is soft.  Musculoskeletal:        General: Normal range of motion.     Cervical back: Normal range of motion and neck supple.  Lymphadenopathy:     Cervical: No cervical adenopathy.  Skin:    General: Skin is warm and dry.  Neurological:     Mental Status: She is alert and oriented to person, place, and time.     Cranial Nerves: No cranial nerve deficit.  Psychiatric:        Behavior: Behavior normal.        Thought Content: Thought content normal.        Judgment: Judgment normal.        Assessment/Plan: 1. Essential hypertension Well controlled, continue norvasc - amLODipine (NORVASC) 5 MG tablet; TAKE 1 TABLET BY MOUTH DAILY.  Dispense: 90 tablet; Refill: 1  2. GAD (generalized anxiety disorder) Well controlled on prozac  3. Need for shingles vaccine - Zoster Vaccine Adjuvanted [facility]) injection; Inject 0.5 mLs into the muscle once for 1 dose.  Dispense: 0.5 mL; Refill: 0  4. B12 deficiency -  B12 and Folate Panel  5. Mixed hyperlipidemia - Lipid Panel With LDL/HDL Ratio  6. Vitamin D deficiency - VITAMIN D 25 Hydroxy (Vit-D Deficiency, Fractures)  7. Abnormal thyroid blood test - TSH + free T4  8. Other fatigue - CBC w/Diff/Platelet - Comprehensive metabolic panel - Lipid Panel With LDL/HDL Ratio - TSH + free T4 - B12 and Folate Panel - VITAMIN D 25 Hydroxy (Vit-D Deficiency, Fractures) - Fe+TIBC+Fer   General Counseling: Anderson Malta verbalizes understanding of the findings of todays visit and agrees with plan of treatment. I have discussed any further diagnostic evaluation that  may be needed or ordered today. We also reviewed her medications today. she has been encouraged to call the office with any questions or concerns that should arise related to todays visit.    Orders Placed This Encounter  Procedures   CBC w/Diff/Platelet   Comprehensive metabolic panel   Lipid Panel With LDL/HDL Ratio   TSH + free T4   B12 and Folate Panel   VITAMIN D 25 Hydroxy (Vit-D Deficiency, Fractures)   Fe+TIBC+Fer    Meds ordered this encounter  Medications   amLODipine (NORVASC) 5 MG tablet    Sig: TAKE 1 TABLET BY MOUTH DAILY.    Dispense:  90 tablet    Refill:  1   Zoster Vaccine Adjuvanted [facility]) injection    Sig: Inject 0.5 mLs into the muscle once for 1 dose.    Dispense:  0.5 mL    Refill:  0    This patient was seen by Drema Dallas, PA-C in collaboration with Dr. Clayborn Bigness as a part of collaborative care agreement.   Total time spent:30 Minutes Time spent includes review of chart, medications, test results, and follow up plan with the patient.      Dr Lavera Guise Internal medicine

## 2022-01-08 ENCOUNTER — Encounter: Payer: BC Managed Care – PPO | Admitting: Physician Assistant

## 2022-01-15 ENCOUNTER — Encounter: Payer: BC Managed Care – PPO | Admitting: Physician Assistant

## 2022-01-17 LAB — COMPREHENSIVE METABOLIC PANEL
ALT: 9 IU/L (ref 0–32)
AST: 12 IU/L (ref 0–40)
Albumin/Globulin Ratio: 2.3 — ABNORMAL HIGH (ref 1.2–2.2)
Albumin: 4.5 g/dL (ref 3.8–4.8)
Alkaline Phosphatase: 51 IU/L (ref 44–121)
BUN/Creatinine Ratio: 11 (ref 9–23)
BUN: 8 mg/dL (ref 6–24)
Bilirubin Total: 0.5 mg/dL (ref 0.0–1.2)
CO2: 22 mmol/L (ref 20–29)
Calcium: 8.4 mg/dL — ABNORMAL LOW (ref 8.7–10.2)
Chloride: 103 mmol/L (ref 96–106)
Creatinine, Ser: 0.7 mg/dL (ref 0.57–1.00)
Globulin, Total: 2 g/dL (ref 1.5–4.5)
Glucose: 104 mg/dL — ABNORMAL HIGH (ref 70–99)
Potassium: 4.2 mmol/L (ref 3.5–5.2)
Sodium: 137 mmol/L (ref 134–144)
Total Protein: 6.5 g/dL (ref 6.0–8.5)
eGFR: 105 mL/min/{1.73_m2} (ref >59)

## 2022-01-17 LAB — IRON,TIBC AND FERRITIN PANEL
Ferritin: 15 ng/mL (ref 15–150)
Iron Saturation: 41 % (ref 15–55)
Iron: 127 ug/dL (ref 27–159)
Total Iron Binding Capacity: 308 ug/dL (ref 250–450)
UIBC: 181 ug/dL (ref 131–425)

## 2022-01-17 LAB — CBC WITH DIFFERENTIAL/PLATELET
Basophils Absolute: 0.1 10*3/uL (ref 0.0–0.2)
Basos: 1 %
EOS (ABSOLUTE): 0.1 10*3/uL (ref 0.0–0.4)
Eos: 3 %
Hematocrit: 37.9 % (ref 34.0–46.6)
Hemoglobin: 12.6 g/dL (ref 11.1–15.9)
Immature Grans (Abs): 0 10*3/uL (ref 0.0–0.1)
Immature Granulocytes: 0 %
Lymphocytes Absolute: 1.3 10*3/uL (ref 0.7–3.1)
Lymphs: 31 %
MCH: 30.2 pg (ref 26.6–33.0)
MCHC: 33.2 g/dL (ref 31.5–35.7)
MCV: 91 fL (ref 79–97)
Monocytes Absolute: 0.4 10*3/uL (ref 0.1–0.9)
Monocytes: 10 %
Neutrophils Absolute: 2.3 10*3/uL (ref 1.4–7.0)
Neutrophils: 55 %
Platelets: 282 10*3/uL (ref 150–450)
RBC: 4.17 x10E6/uL (ref 3.77–5.28)
RDW: 12.6 % (ref 11.7–15.4)
WBC: 4.2 10*3/uL (ref 3.4–10.8)

## 2022-01-17 LAB — LIPID PANEL WITH LDL/HDL RATIO
Cholesterol, Total: 199 mg/dL (ref 100–199)
HDL: 61 mg/dL (ref >39)
LDL Chol Calc (NIH): 123 mg/dL — ABNORMAL HIGH (ref 0–99)
LDL/HDL Ratio: 2 ratio (ref 0.0–3.2)
Triglycerides: 85 mg/dL (ref 0–149)
VLDL Cholesterol Cal: 15 mg/dL (ref 5–40)

## 2022-01-17 LAB — TSH+FREE T4
Free T4: 0.79 ng/dL — ABNORMAL LOW (ref 0.82–1.77)
TSH: 0.761 u[IU]/mL (ref 0.450–4.500)

## 2022-01-17 LAB — VITAMIN D 25 HYDROXY (VIT D DEFICIENCY, FRACTURES): Vit D, 25-Hydroxy: 30 ng/mL (ref 30.0–100.0)

## 2022-01-17 LAB — B12 AND FOLATE PANEL
Folate: >20 ng/mL (ref >3.0)
Vitamin B-12: 467 pg/mL (ref 232–1245)

## 2022-01-19 ENCOUNTER — Ambulatory Visit (INDEPENDENT_AMBULATORY_CARE_PROVIDER_SITE_OTHER): Payer: BC Managed Care – PPO | Admitting: Physician Assistant

## 2022-01-19 ENCOUNTER — Encounter: Payer: Self-pay | Admitting: Physician Assistant

## 2022-01-19 DIAGNOSIS — R7989 Other specified abnormal findings of blood chemistry: Secondary | ICD-10-CM

## 2022-01-19 DIAGNOSIS — Z0001 Encounter for general adult medical examination with abnormal findings: Secondary | ICD-10-CM

## 2022-01-19 DIAGNOSIS — R7301 Impaired fasting glucose: Secondary | ICD-10-CM

## 2022-01-19 DIAGNOSIS — E782 Mixed hyperlipidemia: Secondary | ICD-10-CM

## 2022-01-19 DIAGNOSIS — I10 Essential (primary) hypertension: Secondary | ICD-10-CM | POA: Diagnosis not present

## 2022-01-19 DIAGNOSIS — F411 Generalized anxiety disorder: Secondary | ICD-10-CM | POA: Diagnosis not present

## 2022-01-19 DIAGNOSIS — R300 Dysuria: Secondary | ICD-10-CM

## 2022-01-19 LAB — POCT URINALYSIS DIPSTICK
Bilirubin, UA: NEGATIVE
Blood, UA: NEGATIVE
Glucose, UA: NEGATIVE
Ketones, UA: NEGATIVE
Leukocytes, UA: NEGATIVE
Nitrite, UA: NEGATIVE
Protein, UA: NEGATIVE
Spec Grav, UA: 1.01 (ref 1.010–1.025)
Urobilinogen, UA: 0.2 E.U./dL
pH, UA: 5 (ref 5.0–8.0)

## 2022-01-19 LAB — POCT GLYCOSYLATED HEMOGLOBIN (HGB A1C): Hemoglobin A1C: 5 % (ref 4.0–5.6)

## 2022-01-19 MED ORDER — ROSUVASTATIN CALCIUM 5 MG PO TABS: 5.0000 mg | ORAL_TABLET | Freq: Every day | ORAL | 3 refills | Status: DC

## 2022-01-19 NOTE — Progress Notes (Signed)
[facility] [place], [place] [address]  Internal MEDICINE  Office Visit Note  Patient Name: Stephanie Ferrell  [id number]  [id number]  Date of Service: 01/19/2022  Chief Complaint  Patient presents with   Annual Exam   Hypertension     HPI Pt is here for routine health maintenance examination and has no complaints today -Labs reviewed calcium low and will supplement. Elevated cholesterol and will work on diet and exercise and will also start on crestor 2x per week. May increase to daily if well tolerated. May also start fish oil. Vit D also borderline and will supplement. -borderline free T4 and will recheck labs in 2 months and if still abnormal consider [place] vs starting medication -her glucose was also slightly elevated therefore an A1c was done in office and was normal -her BP continues to be well controlled -She is followed by OBGYN for routine exams and has her breast and pelvic exams done by their office.  -She did her colonoscopy last year and thinks she is supposed to repeat in 7 years. Mammogram up to date  Current Medication: Outpatient Encounter Medications as of 01/19/2022  Medication Sig   amLODipine (NORVASC) 5 MG tablet TAKE 1 TABLET BY MOUTH DAILY.   FLUoxetine (PROZAC) 40 MG capsule TAKE 1 CAPSULE BY MOUTH EVERY DAY   fluticasone (FLONASE) 50 MCG/ACT nasal spray Place 2 sprays into both nostrils daily.   norethindrone (MICRONOR) 0.35 MG tablet Take 1 tablet (0.35 mg total) by mouth daily.   rosuvastatin (CRESTOR) 5 MG tablet Take 1 tablet (5 mg total) by mouth daily.   tolterodine (DETROL LA) 4 MG 24 hr capsule Take 1 capsule (4 mg total) by mouth daily.   No facility-administered encounter medications on file as of 01/19/2022.    Surgical History: Past Surgical History:  Procedure Laterality Date   CESAREAN SECTION     COLONOSCOPY WITH PROPOFOL N/A 03/01/2021   Procedure: COLONOSCOPY WITH PROPOFOL;  Surgeon: Virgel Manifold, MD;   Location: [facility] ENDOSCOPY;  Service: Endoscopy;  Laterality: N/A;    Medical History: Past Medical History:  Diagnosis Date   Hypertension     Family History: Family History  Problem Relation Age of Onset   Diabetes Maternal Uncle    Colon cancer Maternal Grandfather    Cancer Paternal Grandfather    Anxiety disorder Mother    Depression Mother    Hypertension Father       Review of Systems  Constitutional:  Negative for chills, fatigue and unexpected weight change.  HENT:  Negative for congestion, postnasal drip, rhinorrhea, sneezing and sore throat.   Eyes:  Negative for redness.  Respiratory:  Negative for cough, chest tightness and shortness of breath.   Cardiovascular:  Negative for chest pain and palpitations.  Gastrointestinal:  Negative for abdominal pain, constipation, diarrhea, nausea and vomiting.  Genitourinary:  Negative for dysuria and frequency.  Musculoskeletal:  Negative for arthralgias, back pain, joint swelling and neck pain.  Skin:  Negative for rash.  Neurological: Negative.  Negative for tremors and numbness.  Hematological:  Negative for adenopathy. Does not bruise/bleed easily.  Psychiatric/Behavioral:  Negative for behavioral problems (Depression), sleep disturbance and suicidal ideas. The patient is not nervous/anxious.      Vital Signs: BP 124/72   Pulse 72   Temp 97.8 F (36.6 C)   Resp 16   Ht $R'5\' 2"'vm$  (1.575 m)   Wt 160 lb (72.6 kg)   SpO2 96%   BMI 29.26  kg/m    Physical Exam Vitals and nursing note reviewed.  Constitutional:      General: She is not in acute distress.    Appearance: Normal appearance. She is well-developed. She is not diaphoretic.  HENT:     Head: Normocephalic and atraumatic.     Mouth/Throat:     Pharynx: No oropharyngeal exudate.  Eyes:     Pupils: Pupils are equal, round, and reactive to light.  Neck:     Thyroid: No thyromegaly.     Vascular: No JVD.     Trachea: No tracheal deviation.   Cardiovascular:     Rate and Rhythm: Normal rate and regular rhythm.     Heart sounds: Normal heart sounds. No murmur heard.    No friction rub. No gallop.  Pulmonary:     Effort: Pulmonary effort is normal. No respiratory distress.     Breath sounds: No wheezing or rales.  Chest:     Chest wall: No tenderness.  Abdominal:     General: Bowel sounds are normal.     Palpations: Abdomen is soft.     Tenderness: There is no abdominal tenderness.  Musculoskeletal:        General: Normal range of motion.     Cervical back: Normal range of motion and neck supple.  Lymphadenopathy:     Cervical: No cervical adenopathy.  Skin:    General: Skin is warm and dry.  Neurological:     Mental Status: She is alert and oriented to person, place, and time.     Cranial Nerves: No cranial nerve deficit.  Psychiatric:        Behavior: Behavior normal.        Thought Content: Thought content normal.        Judgment: Judgment normal.      LABS: Recent Results (from the past 2160 hour(s))  CBC w/Diff/Platelet     Status: None   Collection Time: 01/16/22 10:55 AM  Result Value Ref Range   WBC 4.2 3.4 - 10.8 x10E3/uL   RBC 4.17 3.77 - 5.28 x10E6/uL   Hemoglobin 12.6 11.1 - 15.9 g/dL   Hematocrit 93.8 10.1 - 46.6 %   MCV 91 79 - 97 fL   MCH 30.2 26.6 - 33.0 pg   MCHC 33.2 31.5 - 35.7 g/dL   RDW 75.1 02.5 - 85.2 %   Platelets 282 150 - 450 x10E3/uL   Neutrophils 55 Not Estab. %   Lymphs 31 Not Estab. %   Monocytes 10 Not Estab. %   Eos 3 Not Estab. %   Basos 1 Not Estab. %   Neutrophils Absolute 2.3 1.4 - 7.0 x10E3/uL   Lymphocytes Absolute 1.3 0.7 - 3.1 x10E3/uL   Monocytes Absolute 0.4 0.1 - 0.9 x10E3/uL   EOS (ABSOLUTE) 0.1 0.0 - 0.4 x10E3/uL   Basophils Absolute 0.1 0.0 - 0.2 x10E3/uL   Immature Granulocytes 0 Not Estab. %   Immature Grans (Abs) 0.0 0.0 - 0.1 x10E3/uL  Comprehensive metabolic panel     Status: Abnormal   Collection Time: 01/16/22 10:55 AM  Result Value Ref Range    Glucose 104 (H) 70 - 99 mg/dL   BUN 8 6 - 24 mg/dL   Creatinine, Ser 7.78 0.57 - 1.00 mg/dL   eGFR 242 >35 TI/RWE/3.15   BUN/Creatinine Ratio 11 9 - 23   Sodium 137 134 - 144 mmol/L   Potassium 4.2 3.5 - 5.2 mmol/L   Chloride 103 96 - 106 mmol/L  CO2 22 20 - 29 mmol/L   Calcium 8.4 (L) 8.7 - 10.2 mg/dL   Total Protein 6.5 6.0 - 8.5 g/dL   Albumin 4.5 3.8 - 4.8 g/dL    Comment:                **Effective January 29, 2022 Albumin reference interval**                  will be changing to:                             Age                  Female          Female                            0 -   7 days       3.6 - 4.9      3.6 - 4.9                            8 -  30 days       3.5 - 4.6      3.5 - 4.6                            1 -   6 months     3.7 - 4.8      3.7 - 4.8                     7 months -   2 years      4.0 - 5.0      4.0 - 5.0                            3 -   5 years      4.1 - 5.0      4.1 - 5.0                            6 -  12 years      4.2 - 5.0      4.2 - 5.0                           13 -  30 years      4.3 - 5.2      4.0 - 5.0                           31 -  50 years      4.1 - 5.1      3.9 - 4.9                           51 -  60 years      3.8 - 4.9      3.8 - 4.9                           61 -  70 years  3.9 - 4.9      3.9 - 4.9                           71 -  80 years      3.8 - 4.8      3.8 - 4.$Remov'8                           8 1 'BdjETq$ -  89 years      3.7 - 4.7      3.7 - 4.7                           90 - 199 years      3.6 - 4.6      3.6 - 4.6    Globulin, Total 2.0 1.5 - 4.5 g/dL   Albumin/Globulin Ratio 2.3 (H) 1.2 - 2.2   Bilirubin Total 0.5 0.0 - 1.2 mg/dL   Alkaline Phosphatase 51 44 - 121 IU/L   AST 12 0 - 40 IU/L   ALT 9 0 - 32 IU/L  Lipid Panel With LDL/HDL Ratio     Status: Abnormal   Collection Time: 01/16/22 10:55 AM  Result Value Ref Range   Cholesterol, Total 199 100 - 199 mg/dL   Triglycerides 85 0 - 149 mg/dL   HDL 61 >39 mg/dL   VLDL  Cholesterol Cal 15 5 - 40 mg/dL   LDL Chol Calc (NIH) 123 (H) 0 - 99 mg/dL   LDL/HDL Ratio 2.0 0.0 - 3.2 ratio    Comment:                                     LDL/HDL Ratio                                             Men  Women                               1/2 Avg.Risk  1.0    1.5                                   Avg.Risk  3.6    3.2                                2X Avg.Risk  6.2    5.0                                3X Avg.Risk  8.0    6.1   TSH + free T4     Status: Abnormal   Collection Time: 01/16/22 10:55 AM  Result Value Ref Range   TSH 0.761 0.450 - 4.500 uIU/mL   Free T4 0.79 (L) 0.82 - 1.77 ng/dL  B12 and Folate Panel     Status: None   Collection Time: 01/16/22 10:55 AM  Result Value Ref Range   Vitamin B-12 467  232 - 1,245 pg/mL   Folate >20.0 >3.0 ng/mL    Comment: A serum folate concentration of less than 3.1 ng/mL is considered to represent clinical deficiency.   VITAMIN D 25 Hydroxy (Vit-D Deficiency, Fractures)     Status: None   Collection Time: 01/16/22 10:55 AM  Result Value Ref Range   Vit D, 25-Hydroxy 30.0 30.0 - 100.0 ng/mL    Comment: Vitamin D deficiency has been defined by the [place] practice guideline as a level of serum 25-OH vitamin D less than 20 ng/mL (1,2). The Endocrine Society went on to further define vitamin D insufficiency as a level between 21 and 29 ng/mL (2). 1. IOM (Institute of Medicine). 2010. Dietary reference    intakes for calcium and D. [place]: The    [employer]. 2. Holick MF, Binkley Deuel, Bischoff-Ferrari HA, et al.    Evaluation, treatment, and prevention of vitamin D    deficiency: an Endocrine Society clinical practice    guideline. JCEM. 2011 Jul; 96(7):1911-30.   Fe+TIBC+Fer     Status: None   Collection Time: 01/16/22 10:55 AM  Result Value Ref Range   Total Iron Binding Capacity 308 250 - 450 ug/dL   UIBC 181 131 - 425 ug/dL   Iron 127 27 - 159 ug/dL    Iron Saturation 41 15 - 55 %   Ferritin 15 15 - 150 ng/mL  POCT Urinalysis Dipstick     Status: None   Collection Time: 01/19/22  9:20 AM  Result Value Ref Range   Color, UA     Clarity, UA     Glucose, UA Negative Negative   Bilirubin, UA negative    Ketones, UA Negative    Spec Grav, UA 1.010 1.010 - 1.025   Blood, UA Negative    pH, UA 5.0 5.0 - 8.0   Protein, UA Negative Negative   Urobilinogen, UA 0.2 0.2 or 1.0 E.U./dL   Nitrite, UA Negative    Leukocytes, UA Negative Negative   Appearance     Odor    POCT HgB A1C     Status: None   Collection Time: 01/19/22  9:45 AM  Result Value Ref Range   Hemoglobin A1C 5.0 4.0 - 5.6 %   HbA1c POC (<> result, manual entry)     HbA1c, POC (prediabetic range)     HbA1c, POC (controlled diabetic range)         Assessment/Plan: 1. Encounter for general adult medical examination with abnormal findings CPE performed and UTD on PHM  2. Essential hypertension Well controlled, continue current medication  3. GAD (generalized anxiety disorder) Well controlled on prozac  4. Mixed hyperlipidemia Will start crestor 2x/ week then may increase to daily if tolerated. Will also work on diet and exercise  5. Impaired fasting glucose - POCT HgB A1C is 5.0 which is normal  6. Abnormal thyroid blood test Low free T4, but normal TSH. Will repeat in 6-8 weeks. If still abnormal will consider thyroid US - TSH + free T4  7. Low serum calcium Will supplement OTC  8. Dysuria - POCT Urinalysis Dipstick   General Counseling: Stephanie Ferrell verbalizes understanding of the findings of todays visit and agrees with plan of treatment. I have discussed any further diagnostic evaluation that may be needed or ordered today. We also reviewed her medications today. she has been encouraged to call the office with any questions or concerns that should arise related to todays  visit.    Counseling:    Orders Placed This Encounter  Procedures   TSH + free  T4   POCT Urinalysis Dipstick   POCT HgB A1C    Meds ordered this encounter  Medications   rosuvastatin (CRESTOR) 5 MG tablet    Sig: Take 1 tablet (5 mg total) by mouth daily.    Dispense:  90 tablet    Refill:  3    This patient was seen by Drema Dallas, PA-C in collaboration with Dr. Clayborn Bigness as a part of collaborative care agreement.  Total time spent:35 Minutes  Time spent includes review of chart, medications, test results, and follow up plan with the patient.     Lavera Guise, MD  Internal Medicine

## 2022-01-25 ENCOUNTER — Ambulatory Visit (INDEPENDENT_AMBULATORY_CARE_PROVIDER_SITE_OTHER): Payer: BC Managed Care – PPO | Admitting: Physician Assistant

## 2022-01-25 VITALS — BP 131/89 | HR 71 | Temp 98.0°F | Resp 16 | Ht 62.0 in | Wt 158.8 lb

## 2022-01-25 DIAGNOSIS — H6692 Otitis media, unspecified, left ear: Secondary | ICD-10-CM

## 2022-01-25 DIAGNOSIS — R42 Dizziness and giddiness: Secondary | ICD-10-CM | POA: Diagnosis not present

## 2022-01-25 MED ORDER — AMOXICILLIN-POT CLAVULANATE 875-125 MG PO TABS: 1.0000 | ORAL_TABLET | Freq: Two times a day (BID) | ORAL | 0 refills | Status: DC

## 2022-01-25 NOTE — Progress Notes (Signed)
[facility] [place], [place] [address]  Internal MEDICINE  Office Visit Note  Patient Name: Stephanie Ferrell  [id number]  [id number]  Date of Service: 01/25/2022  Chief Complaint  Patient presents with   Dizziness    Feeling better - episode occurred on Monday     HPI Pt is here for a sick visit. -Ears have been bothering her with some pressure and pain for about a week. Left worse than right. -Monday morning woke up with the room spinning. Did take some dramamine and that helped. Lasted all day though, not with position changes. Had nausea with it and just had to lay down. -Tuesday felt better with no more room spinning, but still has some headache and ear pressure and nausea -Denies any vomiting or falls  Current Medication:  Outpatient Encounter Medications as of 01/25/2022  Medication Sig   amLODipine (NORVASC) 5 MG tablet TAKE 1 TABLET BY MOUTH DAILY.   amoxicillin-clavulanate (AUGMENTIN) 875-125 MG tablet Take 1 tablet by mouth 2 (two) times daily. Take with food.   FLUoxetine (PROZAC) 40 MG capsule TAKE 1 CAPSULE BY MOUTH EVERY DAY   fluticasone (FLONASE) 50 MCG/ACT nasal spray Place 2 sprays into both nostrils daily.   norethindrone (MICRONOR) 0.35 MG tablet Take 1 tablet (0.35 mg total) by mouth daily.   rosuvastatin (CRESTOR) 5 MG tablet Take 1 tablet (5 mg total) by mouth daily.   tolterodine (DETROL LA) 4 MG 24 hr capsule Take 1 capsule (4 mg total) by mouth daily.   No facility-administered encounter medications on file as of 01/25/2022.      Medical History: Past Medical History:  Diagnosis Date   Hypertension      Vital Signs: BP 131/89   Pulse 71   Temp 98 F (36.7 C)   Resp 16   Ht '5\' 2"'$  (1.575 m)   Wt 158 lb 12.8 oz (72 kg)   SpO2 97%   BMI 29.04 kg/m    Review of Systems  Constitutional:  Negative for fatigue and fever.  HENT:  Positive for ear pain. Negative for congestion, mouth sores and postnasal drip.    Respiratory:  Negative for cough.   Cardiovascular:  Negative for chest pain.  Gastrointestinal:  Positive for nausea. Negative for vomiting.  Genitourinary:  Negative for flank pain.  Neurological:  Positive for dizziness and headaches.  Psychiatric/Behavioral: Negative.      Physical Exam Vitals and nursing note reviewed.  Constitutional:      General: She is not in acute distress.    Appearance: Normal appearance. She is well-developed. She is not diaphoretic.  HENT:     Head: Normocephalic and atraumatic.     Ears:     Comments: Some fluid on left ear, min cerumen present bilaterally partially limiting view    Mouth/Throat:     Pharynx: No oropharyngeal exudate.  Eyes:     Pupils: Pupils are equal, round, and reactive to light.  Neck:     Thyroid: No thyromegaly.     Vascular: No JVD.     Trachea: No tracheal deviation.  Cardiovascular:     Rate and Rhythm: Normal rate and regular rhythm.     Heart sounds: Normal heart sounds. No murmur heard.    No friction rub. No gallop.  Pulmonary:     Effort: Pulmonary effort is normal.     Breath sounds: No rales.  Abdominal:     General: Bowel sounds are normal.  Palpations: Abdomen is soft.  Musculoskeletal:        General: Normal range of motion.     Cervical back: Normal range of motion and neck supple.  Lymphadenopathy:     Cervical: No cervical adenopathy.  Skin:    General: Skin is warm and dry.  Neurological:     Mental Status: She is alert and oriented to person, place, and time.     Cranial Nerves: No cranial nerve deficit.  Psychiatric:        Behavior: Behavior normal.        Thought Content: Thought content normal.        Judgment: Judgment normal.       Assessment/Plan: 1. Left acute otitis media Will go ahead and treat with augmentin BID with food and will use flonase as well. - amoxicillin-clavulanate (AUGMENTIN) 875-125 MG tablet; Take 1 tablet by mouth 2 (two) times daily. Take with food.   Dispense: 20 tablet; Refill: 0  2. Dizziness Improved, likely secondary to ear infection and will treat this. May call office if recurrence and meclizine can be sent if needed   General Counseling: Rogue Bussing understanding of the findings of todays visit and agrees with plan of treatment. I have discussed any further diagnostic evaluation that may be needed or ordered today. We also reviewed her medications today. she has been encouraged to call the office with any questions or concerns that should arise related to todays visit.    Counseling:    No orders of the defined types were placed in this encounter.   Meds ordered this encounter  Medications   amoxicillin-clavulanate (AUGMENTIN) 875-125 MG tablet    Sig: Take 1 tablet by mouth 2 (two) times daily. Take with food.    Dispense:  20 tablet    Refill:  0    Time spent:25 Minutes

## 2022-01-26 ENCOUNTER — Other Ambulatory Visit: Payer: Self-pay

## 2022-01-26 ENCOUNTER — Emergency Department: Payer: BC Managed Care – PPO

## 2022-01-26 ENCOUNTER — Observation Stay
Admission: EM | Admit: 2022-01-26 | Discharge: 2022-01-28 | Disposition: A | Discharge status: Home / Self Care | Payer: BC Managed Care – PPO | Attending: General Surgery | Admitting: General Surgery

## 2022-01-26 ENCOUNTER — Telehealth: Payer: Self-pay

## 2022-01-26 DIAGNOSIS — I10 Essential (primary) hypertension: Secondary | ICD-10-CM | POA: Insufficient documentation

## 2022-01-26 DIAGNOSIS — K353 Acute appendicitis with localized peritonitis, without perforation or gangrene: Secondary | ICD-10-CM | POA: Diagnosis not present

## 2022-01-26 DIAGNOSIS — Z79899 Other long term (current) drug therapy: Secondary | ICD-10-CM | POA: Diagnosis not present

## 2022-01-26 DIAGNOSIS — K382 Diverticulum of appendix: Secondary | ICD-10-CM | POA: Diagnosis not present

## 2022-01-26 DIAGNOSIS — R1031 Right lower quadrant pain: Secondary | ICD-10-CM | POA: Diagnosis present

## 2022-01-26 DIAGNOSIS — K3580 Unspecified acute appendicitis: Secondary | ICD-10-CM

## 2022-01-26 LAB — CBC
HCT: 39.4 % (ref 36.0–46.0)
Hemoglobin: 12.9 g/dL (ref 12.0–15.0)
MCH: 29.5 pg (ref 26.0–34.0)
MCHC: 32.7 g/dL (ref 30.0–36.0)
MCV: 90.2 fL (ref 80.0–100.0)
Platelets: 281 10*3/uL (ref 150–400)
RBC: 4.37 MIL/uL (ref 3.87–5.11)
RDW: 12.5 % (ref 11.5–15.5)
WBC: 10.9 10*3/uL — ABNORMAL HIGH (ref 4.0–10.5)
nRBC: 0 % (ref 0.0–0.2)

## 2022-01-26 LAB — COMPREHENSIVE METABOLIC PANEL
ALT: 13 U/L (ref 0–44)
AST: 18 U/L (ref 15–41)
Albumin: 4.2 g/dL (ref 3.5–5.0)
Alkaline Phosphatase: 44 U/L (ref 38–126)
Anion gap: 8 (ref 5–15)
BUN: 15 mg/dL (ref 6–20)
CO2: 23 mmol/L (ref 22–32)
Calcium: 8.5 mg/dL — ABNORMAL LOW (ref 8.9–10.3)
Chloride: 107 mmol/L (ref 98–111)
Creatinine, Ser: 0.77 mg/dL (ref 0.44–1.00)
GFR, Estimated: >60 mL/min (ref >60)
Glucose, Bld: 122 mg/dL — ABNORMAL HIGH (ref 70–99)
Potassium: 3.7 mmol/L (ref 3.5–5.1)
Sodium: 138 mmol/L (ref 135–145)
Total Bilirubin: 0.8 mg/dL (ref 0.3–1.2)
Total Protein: 7.1 g/dL (ref 6.5–8.1)

## 2022-01-26 LAB — URINALYSIS, ROUTINE W REFLEX MICROSCOPIC
Bilirubin Urine: NEGATIVE
Glucose, UA: NEGATIVE mg/dL
Ketones, ur: 5 mg/dL — AB
Leukocytes,Ua: NEGATIVE
Nitrite: NEGATIVE
Protein, ur: 30 mg/dL — AB
Specific Gravity, Urine: 1.033 — ABNORMAL HIGH (ref 1.005–1.030)
pH: 5 (ref 5.0–8.0)

## 2022-01-26 LAB — POC URINE PREG, ED: Preg Test, Ur: NEGATIVE

## 2022-01-26 MED ORDER — IOHEXOL 350 MG/ML SOLN
75.0000 mL | Freq: Once | INTRAVENOUS | Status: AC | PRN
  Administered 2022-01-26: 75 mL via INTRAVENOUS

## 2022-01-26 NOTE — ED Notes (Signed)
Pt to ct 

## 2022-01-26 NOTE — ED Provider Triage Note (Signed)
Emergency Medicine Provider Triage Evaluation Note  SABEEN PIECHOCKI, a 51 y.o. female  was evaluated in triage.  Pt complains of 1 week of intermittent nausea, vomiting, and sudden onset of worsening right lower quadrant pain today.  Patient is status post 3 C-sections but still has a appendix.  Review of Systems  Positive: RLQ pain, no N/V Negative: CP, SOB  Physical Exam  BP 130/80   Pulse 98   Temp 99.1 F (37.3 C) (Oral)   Resp 16   Ht '5\' 2"'$  (1.575 m)   Wt 72 kg   LMP 01/26/2022   SpO2 98%   BMI 29.03 kg/m  Gen:   Awake, no distress NAD Resp:  Normal effort CTA MSK:   Moves extremities without difficulty  Other:  Soft, nontender  Medical Decision Making  Medically screening exam initiated at 9:24 PM.  Appropriate orders placed.  MEITAL RIEHL was informed that the remainder of the evaluation will be completed by another provider, this initial triage assessment does not replace that evaluation, and the importance of remaining in the ED until their evaluation is complete.  Patient with RLQ pain, NV increased for the last week.    Melvenia Needles, PA-C 01/26/22 2126

## 2022-01-26 NOTE — ED Triage Notes (Signed)
Pt states right lower quadrant pain with emesis and fever at home. Pt states still has appendix. Pt denies dysuria/diarrhea.

## 2022-01-26 NOTE — Telephone Encounter (Signed)
Pt called and advised she was having nausea and right sided pain in stomach.  Per Alyssa we advised patient that she should go to the ER to rule out appendicitis.

## 2022-01-27 ENCOUNTER — Encounter
Admission: EM | Disposition: A | Discharge status: Home / Self Care | Payer: Self-pay | Source: Home / Self Care | Attending: Emergency Medicine

## 2022-01-27 ENCOUNTER — Observation Stay: Payer: BC Managed Care – PPO | Admitting: Anesthesiology

## 2022-01-27 ENCOUNTER — Other Ambulatory Visit: Payer: Self-pay

## 2022-01-27 DIAGNOSIS — K353 Acute appendicitis with localized peritonitis, without perforation or gangrene: Secondary | ICD-10-CM | POA: Diagnosis present

## 2022-01-27 HISTORY — PX: XI ROBOTIC LAPAROSCOPIC ASSISTED APPENDECTOMY: SHX6877

## 2022-01-27 SURGERY — APPENDECTOMY, ROBOT-ASSISTED, LAPAROSCOPIC
Anesthesia: General | Site: Abdomen

## 2022-01-27 MED ORDER — CEFAZOLIN SODIUM-DEXTROSE 2-4 GM/100ML-% IV SOLN
INTRAVENOUS | Status: AC
  Filled 2022-01-27: qty 100

## 2022-01-27 MED ORDER — ACETAMINOPHEN 325 MG PO TABS: 650.0000 mg | ORAL_TABLET | Freq: Four times a day (QID) | ORAL | Status: DC | PRN

## 2022-01-27 MED ORDER — FESOTERODINE FUMARATE ER 4 MG PO TB24
4.0000 mg | ORAL_TABLET | Freq: Every day | ORAL | Status: DC
  Administered 2022-01-27 – 2022-01-28 (×2): 4 mg via ORAL
  Filled 2022-01-27 (×2): qty 1

## 2022-01-27 MED ORDER — ROCURONIUM BROMIDE 100 MG/10ML IV SOLN
INTRAVENOUS | Status: DC | PRN
  Administered 2022-01-27: 40 mg via INTRAVENOUS

## 2022-01-27 MED ORDER — FENTANYL CITRATE (PF) 100 MCG/2ML IJ SOLN
25.0000 ug | INTRAMUSCULAR | Status: DC | PRN
  Administered 2022-01-27: 50 ug via INTRAVENOUS

## 2022-01-27 MED ORDER — PROPOFOL 10 MG/ML IV BOLUS
INTRAVENOUS | Status: DC | PRN
  Administered 2022-01-27: 140 mg via INTRAVENOUS

## 2022-01-27 MED ORDER — PIPERACILLIN-TAZOBACTAM 3.375 G IVPB
3.3750 g | Freq: Three times a day (TID) | INTRAVENOUS | Status: DC
Start: 2022-01-27 — End: 2022-01-27
  Administered 2022-01-27: 3.375 g via INTRAVENOUS
  Filled 2022-01-27 (×2): qty 50

## 2022-01-27 MED ORDER — MEPERIDINE HCL 25 MG/ML IJ SOLN: 6.2500 mg | INTRAMUSCULAR | Status: DC | PRN

## 2022-01-27 MED ORDER — EPHEDRINE SULFATE (PRESSORS) 50 MG/ML IJ SOLN
INTRAMUSCULAR | Status: DC | PRN
  Administered 2022-01-27: 10 mg via INTRAVENOUS

## 2022-01-27 MED ORDER — PIPERACILLIN-TAZOBACTAM 3.375 G IVPB 30 MIN
3.3750 g | Freq: Once | INTRAVENOUS | Status: DC
  Filled 2022-01-27: qty 50

## 2022-01-27 MED ORDER — BUPIVACAINE-EPINEPHRINE (PF) 0.5% -1:200000 IJ SOLN
INTRAMUSCULAR | Status: DC | PRN
  Administered 2022-01-27: 30 mL via PERINEURAL

## 2022-01-27 MED ORDER — KETOROLAC TROMETHAMINE 30 MG/ML IJ SOLN
INTRAMUSCULAR | Status: AC
  Filled 2022-01-27: qty 1

## 2022-01-27 MED ORDER — ENOXAPARIN SODIUM 40 MG/0.4ML IJ SOSY
40.0000 mg | PREFILLED_SYRINGE | INTRAMUSCULAR | Status: DC
  Administered 2022-01-28: 40 mg via SUBCUTANEOUS
  Filled 2022-01-27: qty 0.4

## 2022-01-27 MED ORDER — HYDROCODONE-ACETAMINOPHEN 7.5-325 MG PO TABS: 1.0000 | ORAL_TABLET | Freq: Once | ORAL | Status: DC | PRN

## 2022-01-27 MED ORDER — LACTATED RINGERS IV SOLN: INTRAVENOUS | Status: DC

## 2022-01-27 MED ORDER — HYDROCODONE-ACETAMINOPHEN 5-325 MG PO TABS
1.0000 | ORAL_TABLET | ORAL | Status: DC | PRN
  Administered 2022-01-27 – 2022-01-28 (×5): 2 via ORAL
  Filled 2022-01-27 (×5): qty 2

## 2022-01-27 MED ORDER — FENTANYL CITRATE (PF) 100 MCG/2ML IJ SOLN
INTRAMUSCULAR | Status: AC
  Filled 2022-01-27: qty 2

## 2022-01-27 MED ORDER — DROPERIDOL 2.5 MG/ML IJ SOLN: 0.6250 mg | Freq: Once | INTRAMUSCULAR | Status: DC | PRN

## 2022-01-27 MED ORDER — FENTANYL CITRATE (PF) 100 MCG/2ML IJ SOLN
INTRAMUSCULAR | Status: AC
  Administered 2022-01-27: 50 ug via INTRAVENOUS
  Filled 2022-01-27: qty 2

## 2022-01-27 MED ORDER — SODIUM CHLORIDE 0.9 % IV SOLN: INTRAVENOUS | Status: DC

## 2022-01-27 MED ORDER — ONDANSETRON HCL 4 MG/2ML IJ SOLN
INTRAMUSCULAR | Status: AC
  Filled 2022-01-27: qty 2

## 2022-01-27 MED ORDER — SUCCINYLCHOLINE CHLORIDE 200 MG/10ML IV SOSY
PREFILLED_SYRINGE | INTRAVENOUS | Status: DC | PRN
  Administered 2022-01-27: 100 mg via INTRAVENOUS

## 2022-01-27 MED ORDER — MORPHINE SULFATE (PF) 4 MG/ML IV SOLN: 4.0000 mg | INTRAVENOUS | Status: DC | PRN

## 2022-01-27 MED ORDER — LIDOCAINE HCL (PF) 2 % IJ SOLN
INTRAMUSCULAR | Status: AC
  Filled 2022-01-27: qty 5

## 2022-01-27 MED ORDER — ACETAMINOPHEN 650 MG RE SUPP: 650.0000 mg | Freq: Four times a day (QID) | RECTAL | Status: DC | PRN

## 2022-01-27 MED ORDER — ONDANSETRON HCL 4 MG/2ML IJ SOLN
4.0000 mg | Freq: Once | INTRAMUSCULAR | Status: DC
  Filled 2022-01-27: qty 2

## 2022-01-27 MED ORDER — ROCURONIUM BROMIDE 10 MG/ML (PF) SYRINGE
PREFILLED_SYRINGE | INTRAVENOUS | Status: AC
  Filled 2022-01-27: qty 10

## 2022-01-27 MED ORDER — ONDANSETRON HCL 4 MG/2ML IJ SOLN: 4.0000 mg | Freq: Four times a day (QID) | INTRAMUSCULAR | Status: DC | PRN

## 2022-01-27 MED ORDER — CEFAZOLIN SODIUM-DEXTROSE 2-3 GM-%(50ML) IV SOLR
INTRAVENOUS | Status: DC | PRN
  Administered 2022-01-27: 2 g via INTRAVENOUS

## 2022-01-27 MED ORDER — MIDAZOLAM HCL 2 MG/2ML IJ SOLN
INTRAMUSCULAR | Status: DC | PRN
  Administered 2022-01-27: 2 mg via INTRAVENOUS

## 2022-01-27 MED ORDER — AMLODIPINE BESYLATE 5 MG PO TABS
5.0000 mg | ORAL_TABLET | Freq: Every day | ORAL | Status: DC
  Administered 2022-01-27 – 2022-01-28 (×2): 5 mg via ORAL
  Filled 2022-01-27 (×2): qty 1

## 2022-01-27 MED ORDER — MORPHINE SULFATE (PF) 4 MG/ML IV SOLN
4.0000 mg | Freq: Once | INTRAVENOUS | Status: AC
  Administered 2022-01-27: 4 mg via INTRAVENOUS
  Filled 2022-01-27 (×2): qty 1

## 2022-01-27 MED ORDER — PROPOFOL 10 MG/ML IV BOLUS
INTRAVENOUS | Status: AC
  Filled 2022-01-27: qty 20

## 2022-01-27 MED ORDER — DEXAMETHASONE SODIUM PHOSPHATE 10 MG/ML IJ SOLN
INTRAMUSCULAR | Status: DC | PRN
  Administered 2022-01-27: 10 mg via INTRAVENOUS

## 2022-01-27 MED ORDER — KETOROLAC TROMETHAMINE 30 MG/ML IJ SOLN
INTRAMUSCULAR | Status: DC | PRN
  Administered 2022-01-27: 30 mg via INTRAVENOUS

## 2022-01-27 MED ORDER — FENTANYL CITRATE (PF) 100 MCG/2ML IJ SOLN
INTRAMUSCULAR | Status: DC | PRN
  Administered 2022-01-27: 100 ug via INTRAVENOUS

## 2022-01-27 MED ORDER — SUGAMMADEX SODIUM 200 MG/2ML IV SOLN
INTRAVENOUS | Status: DC | PRN
  Administered 2022-01-27: 300 mg via INTRAVENOUS

## 2022-01-27 MED ORDER — DEXAMETHASONE SODIUM PHOSPHATE 10 MG/ML IJ SOLN
INTRAMUSCULAR | Status: AC
  Filled 2022-01-27: qty 1

## 2022-01-27 MED ORDER — ONDANSETRON HCL 4 MG/2ML IJ SOLN: 4.0000 mg | Freq: Once | INTRAMUSCULAR | Status: DC | PRN

## 2022-01-27 MED ORDER — LIDOCAINE HCL (CARDIAC) PF 100 MG/5ML IV SOSY
PREFILLED_SYRINGE | INTRAVENOUS | Status: DC | PRN
  Administered 2022-01-27: 100 mg via INTRAVENOUS

## 2022-01-27 MED ORDER — LACTATED RINGERS IV SOLN: INTRAVENOUS | Status: DC | PRN

## 2022-01-27 MED ORDER — MIDAZOLAM HCL 2 MG/2ML IJ SOLN
INTRAMUSCULAR | Status: AC
  Filled 2022-01-27: qty 2

## 2022-01-27 MED ORDER — FLUOXETINE HCL 20 MG PO CAPS
40.0000 mg | ORAL_CAPSULE | Freq: Every day | ORAL | Status: DC
  Administered 2022-01-27 – 2022-01-28 (×2): 40 mg via ORAL
  Filled 2022-01-27 (×2): qty 2

## 2022-01-27 MED ORDER — 0.9 % SODIUM CHLORIDE (POUR BTL) OPTIME
TOPICAL | Status: DC | PRN
  Administered 2022-01-27: 500 mL

## 2022-01-27 MED ORDER — FLUTICASONE PROPIONATE 50 MCG/ACT NA SUSP
2.0000 | Freq: Every day | NASAL | Status: DC
  Filled 2022-01-27: qty 16

## 2022-01-27 MED ORDER — ONDANSETRON 4 MG PO TBDP: 4.0000 mg | ORAL_TABLET | Freq: Four times a day (QID) | ORAL | Status: DC | PRN

## 2022-01-27 MED ORDER — NORETHINDRONE 0.35 MG PO TABS: 1.0000 | ORAL_TABLET | Freq: Every day | ORAL | Status: DC

## 2022-01-27 MED ORDER — PANTOPRAZOLE SODIUM 40 MG IV SOLR
40.0000 mg | Freq: Every day | INTRAVENOUS | Status: DC
  Administered 2022-01-27 (×2): 40 mg via INTRAVENOUS
  Filled 2022-01-27 (×2): qty 10

## 2022-01-27 MED ORDER — ONDANSETRON HCL 4 MG/2ML IJ SOLN
INTRAMUSCULAR | Status: DC | PRN
  Administered 2022-01-27: 4 mg via INTRAVENOUS

## 2022-01-27 MED ORDER — SUCCINYLCHOLINE CHLORIDE 200 MG/10ML IV SOSY
PREFILLED_SYRINGE | INTRAVENOUS | Status: AC
  Filled 2022-01-27: qty 10

## 2022-01-27 SURGICAL SUPPLY — 66 items
BAG PRESSURE INF REUSE 1000 (BAG) IMPLANT
BLADE SURG SZ11 CARB STEEL (BLADE) ×2 IMPLANT
CANNULA REDUC XI 12-8 STAPL (CANNULA) ×1
CANNULA REDUCER 12-8 DVNC XI (CANNULA) ×1 IMPLANT
COVER TIP SHEARS 8 DVNC (MISCELLANEOUS) ×1 IMPLANT
COVER TIP SHEARS 8MM DA VINCI (MISCELLANEOUS) ×1
DEFOGGER SCOPE WARMER CLEARIFY (MISCELLANEOUS) ×1 IMPLANT
DERMABOND ADVANCED (GAUZE/BANDAGES/DRESSINGS) ×1
DERMABOND ADVANCED .7 DNX12 (GAUZE/BANDAGES/DRESSINGS) ×1 IMPLANT
DRAPE ARM DVNC X/XI (DISPOSABLE) ×4 IMPLANT
DRAPE COLUMN DVNC XI (DISPOSABLE) ×1 IMPLANT
DRAPE DA VINCI XI ARM (DISPOSABLE) ×4
DRAPE DA VINCI XI COLUMN (DISPOSABLE) ×1
ELECT REM PT RETURN 9FT ADLT (ELECTROSURGICAL) ×2
ELECTRODE REM PT RTRN 9FT ADLT (ELECTROSURGICAL) ×1 IMPLANT
GLOVE BIOGEL PI IND STRL 6.5 (GLOVE) ×2 IMPLANT
GLOVE BIOGEL PI INDICATOR 6.5 (GLOVE) ×2
GLOVE SURG SYN 6.5 ES PF (GLOVE) ×4 IMPLANT
GLOVE SURG SYN 6.5 PF PI (GLOVE) ×2 IMPLANT
GOWN STRL REUS W/ TWL LRG LVL3 (GOWN DISPOSABLE) ×3 IMPLANT
GOWN STRL REUS W/TWL LRG LVL3 (GOWN DISPOSABLE) ×3
GRASPER SUT TROCAR 14GX15 (MISCELLANEOUS) IMPLANT
IRRIGATOR SUCT 8 DISP DVNC XI (IRRIGATION / IRRIGATOR) IMPLANT
IRRIGATOR SUCTION 8MM XI DISP (IRRIGATION / IRRIGATOR)
IV NS 1000ML (IV SOLUTION)
IV NS 1000ML BAXH (IV SOLUTION) IMPLANT
KIT PINK PAD W/HEAD ARE REST (MISCELLANEOUS) ×2 IMPLANT
KIT PINK PAD W/HEAD ARM REST (MISCELLANEOUS) ×1 IMPLANT
LABEL OR SOLS (LABEL) IMPLANT
MANIFOLD NEPTUNE II (INSTRUMENTS) ×2 IMPLANT
NDL INSUFFLATION 14GA 120MM (NEEDLE) ×1 IMPLANT
NEEDLE HYPO 22GX1.5 SAFETY (NEEDLE) ×2 IMPLANT
NEEDLE INSUFFLATION 14GA 120MM (NEEDLE) ×2 IMPLANT
OBTURATOR OPTICAL STANDARD 8MM (TROCAR) ×1
OBTURATOR OPTICAL STND 8 DVNC (TROCAR) ×1
OBTURATOR OPTICALSTD 8 DVNC (TROCAR) ×1 IMPLANT
PACK LAP CHOLECYSTECTOMY (MISCELLANEOUS) ×2 IMPLANT
RELOAD STAPLE 45 2.5 WHT DVNC (STAPLE) IMPLANT
RELOAD STAPLE 45 3.5 BLU DVNC (STAPLE) ×1 IMPLANT
RELOAD STAPLER 2.5X45 WHT DVNC (STAPLE) IMPLANT
RELOAD STAPLER 3.5X45 BLU DVNC (STAPLE) ×1 IMPLANT
SEAL CANN UNIV 5-8 DVNC XI (MISCELLANEOUS) ×3 IMPLANT
SEAL XI 5MM-8MM UNIVERSAL (MISCELLANEOUS) ×3
SEALER VESSEL DA VINCI XI (MISCELLANEOUS)
SEALER VESSEL EXT DVNC XI (MISCELLANEOUS) IMPLANT
SET TUBE SMOKE EVAC HIGH FLOW (TUBING) ×2 IMPLANT
SOLUTION ELECTROLUBE (MISCELLANEOUS) ×2 IMPLANT
SPONGE T-LAP 4X18 ~~LOC~~+RFID (SPONGE) ×2 IMPLANT
STAPLER 45 DA VINCI SURE FORM (STAPLE) ×1
STAPLER 45 SUREFORM DVNC (STAPLE) ×1 IMPLANT
STAPLER CANNULA SEAL DVNC XI (STAPLE) ×1 IMPLANT
STAPLER CANNULA SEAL XI (STAPLE) ×1
STAPLER RELOAD 2.5X45 WHITE (STAPLE)
STAPLER RELOAD 2.5X45 WHT DVNC (STAPLE)
STAPLER RELOAD 3.5X45 BLU DVNC (STAPLE) ×1
STAPLER RELOAD 3.5X45 BLUE (STAPLE) ×1
SUT MNCRL AB 4-0 PS2 18 (SUTURE) ×2 IMPLANT
SUT VIC AB 2-0 SH 27 (SUTURE) ×1
SUT VIC AB 2-0 SH 27XBRD (SUTURE) ×1 IMPLANT
SUT VICRYL 0 AB UR-6 (SUTURE) ×2 IMPLANT
SUT VLOC 90 6 CV-15 VIOLET (SUTURE) ×2 IMPLANT
SYR 30ML LL (SYRINGE) ×2 IMPLANT
SYS BAG RETRIEVAL 10MM (BASKET) ×2
SYSTEM BAG RETRIEVAL 10MM (BASKET) ×1 IMPLANT
TRAY FOLEY MTR SLVR 16FR STAT (SET/KITS/TRAYS/PACK) IMPLANT
WATER STERILE IRR 500ML POUR (IV SOLUTION) ×2 IMPLANT

## 2022-01-27 NOTE — Transfer of Care (Signed)
Immediate Anesthesia Transfer of Care Note  Patient: Stephanie Ferrell  Procedure(s) Performed: XI ROBOTIC LAPAROSCOPIC ASSISTED APPENDECTOMY (Abdomen)  Patient Location: PACU  Anesthesia Type:General  Level of Consciousness: awake  Airway & Oxygen Therapy: Patient Spontanous Breathing  Post-op Assessment: Report given to RN  Post vital signs: stable  Last Vitals:  Vitals Value Taken Time  BP    Temp 37.6 C 01/27/22 1036  Pulse    Resp    SpO2      Last Pain:  Vitals:   01/27/22 0535  TempSrc: Oral  PainSc:       Patients Stated Pain Goal: 0 (15/52/08 0223)  Complications: No notable events documented.

## 2022-01-27 NOTE — ED Provider Notes (Signed)
[facility] Provider Note    Event Date/Time   First MD Initiated Contact with Patient 01/27/22 0004     (approximate)   History   Abdominal Pain   HPI  Stephanie Ferrell is a 51 y.o. female with history of hypertension, hyperlipidemia, previous C-section x2 who presents to the emergency department with complaints of 3 days of progressively worsening right lower quadrant pain, fever of 100.9 at home, nausea and vomiting.  Has had some loose stools.  Currently on her menstrual cycle but denies any recent abnormal vaginal bleeding, discharge.  No dysuria.   History provided by patient and husband.    Past Medical History:  Diagnosis Date   Hypertension     Past Surgical History:  Procedure Laterality Date   CESAREAN SECTION     COLONOSCOPY WITH PROPOFOL N/A 03/01/2021   Procedure: COLONOSCOPY WITH PROPOFOL;  Surgeon: Virgel Manifold, MD;  Location: [facility] ENDOSCOPY;  Service: Endoscopy;  Laterality: N/A;    MEDICATIONS:  Prior to Admission medications   Medication Sig Start Date End Date Taking? Authorizing Provider  amLODipine (NORVASC) 5 MG tablet TAKE 1 TABLET BY MOUTH DAILY. 12/28/21   McDonough, Si Gaul, PA-C  amoxicillin-clavulanate (AUGMENTIN) 875-125 MG tablet Take 1 tablet by mouth 2 (two) times daily. Take with food. 01/25/22   McDonough, Si Gaul, PA-C  FLUoxetine (PROZAC) 40 MG capsule TAKE 1 CAPSULE BY MOUTH EVERY DAY 06/12/21   Gae Dry, MD  fluticasone [facility]) 50 MCG/ACT nasal spray Place 2 sprays into both nostrils daily. 03/02/20   Ronnell Freshwater, NP  norethindrone (MICRONOR) 0.35 MG tablet Take 1 tablet (0.35 mg total) by mouth daily. 06/12/21   Gae Dry, MD  rosuvastatin (CRESTOR) 5 MG tablet Take 1 tablet (5 mg total) by mouth daily. 01/19/22   McDonough, Si Gaul, PA-C  tolterodine (DETROL LA) 4 MG 24 hr capsule Take 1 capsule (4 mg total) by mouth daily. 06/12/21   Gae Dry, MD    Physical Exam    Triage Vital Signs: ED Triage Vitals  Enc Vitals Group     BP 01/26/22 2116 130/80     Pulse Rate 01/26/22 2116 98     Resp 01/26/22 2116 16     Temp 01/26/22 2116 99.1 F (37.3 C)     Temp Source 01/26/22 2116 Oral     SpO2 01/26/22 2116 98 %     Weight 01/26/22 2117 158 lb 11.7 oz (72 kg)     Height 01/26/22 2117 '5\' 2"'$  (1.575 m)     Head Circumference --      Peak Flow --      Pain Score --      Pain Loc --      Pain Edu? --      Excl. in [place]? --     Most recent vital signs: Vitals:   01/27/22 0015 01/27/22 0144  BP: 134/67 138/66  Pulse: 72 69  Resp: 15 20  Temp:  98.6 F (37 C)  SpO2: 99% 100%    CONSTITUTIONAL: Alert and oriented and responds appropriately to questions. Well-appearing; well-nourished HEAD: Normocephalic, atraumatic EYES: Conjunctivae clear, pupils appear equal, sclera nonicteric ENT: normal nose; moist mucous membranes NECK: Supple, normal ROM CARD: RRR; S1 and S2 appreciated; no murmurs, no clicks, no rubs, no gallops RESP: Normal chest excursion without splinting or tachypnea; breath sounds clear and equal bilaterally; no wheezes, no rhonchi, no rales, no hypoxia or respiratory distress, speaking  full sentences ABD/GI: Normal bowel sounds; non-distended; soft, tender at McBurney's point without guarding or rebound BACK: The back appears normal EXT: Normal ROM in all joints; no deformity noted, no edema; no cyanosis SKIN: Normal color for age and race; warm; no rash on exposed skin NEURO: Moves all extremities equally, normal speech PSYCH: The patient's mood and manner are appropriate.   ED Results / Procedures / Treatments   LABS: (all labs ordered are listed, but only abnormal results are displayed) Labs Reviewed  COMPREHENSIVE METABOLIC PANEL - Abnormal; Notable for the following components:      Result Value   Glucose, Bld 122 (*)    Calcium 8.5 (*)    All other components within normal limits  CBC - Abnormal; Notable for the  following components:   WBC 10.9 (*)    All other components within normal limits  URINALYSIS, ROUTINE W REFLEX MICROSCOPIC - Abnormal; Notable for the following components:   Color, Urine AMBER (*)    APPearance CLOUDY (*)    Specific Gravity, Urine 1.033 (*)    Hgb urine dipstick MODERATE (*)    Ketones, ur 5 (*)    Protein, ur 30 (*)    Bacteria, UA RARE (*)    All other components within normal limits  POC URINE PREG, ED     EKG:   RADIOLOGY: My personal review and interpretation of imaging: CT scan concerning for acute early appendicitis.  I have personally reviewed all radiology reports.   CT ABDOMEN PELVIS W CONTRAST  Result Date: 01/26/2022 CLINICAL DATA:  Right lower quadrant pain with emesis EXAM: CT ABDOMEN AND PELVIS WITH CONTRAST TECHNIQUE: Multidetector CT imaging of the abdomen and pelvis was performed using the standard protocol following bolus administration of intravenous contrast. RADIATION DOSE REDUCTION: This exam was performed according to the departmental dose-optimization program which includes automated exposure control, adjustment of the mA and/or kV according to patient size and/or use of iterative reconstruction technique. CONTRAST:  20m OMNIPAQUE IOHEXOL 350 MG/ML SOLN COMPARISON:  None Available. FINDINGS: Lower chest: No acute abnormality. Hepatobiliary: No focal liver abnormality is seen. No gallstones, gallbladder wall thickening, or biliary dilatation. Pancreas: Unremarkable. No pancreatic ductal dilatation or surrounding inflammatory changes. Spleen: Normal in size without focal abnormality. Adrenals/Urinary Tract: Adrenal glands are normal. Kidneys show no hydronephrosis. Subcentimeter hypodensities too small to further characterize. The bladder is unremarkable. Stomach/Bowel: The stomach is nonenlarged. No dilated small bowel. No acute bowel wall thickening. Fluid-filled nondilated loops of small bowel. Borderline to slightly enlarged appendix, measures  between 7 and 8 mm, no significant gas within the lumen. Not much Peri appendiceal stranding. Vascular/Lymphatic: No significant vascular findings are present. No enlarged abdominal or pelvic lymph nodes. Reproductive: Uterus and bilateral adnexa are unremarkable. Other: Negative for pelvic effusion or free air. Small fat containing umbilical hernia. Musculoskeletal: No acute osseous abnormality IMPRESSION: 1. Findings are equivocal for appendicitis, the appendix is borderline to slightly enlarged up to 8 mm and without intraluminal gas, however not much periappendiceal soft tissue stranding is present. 2. Otherwise no CT evidence for acute intra-abdominal or pelvic abnormality Electronically Signed   By: KDonavan FoilM.D.   On: 01/26/2022 22:58     PROCEDURES:  Critical Care performed: No      Procedures    IMPRESSION / MDM / ASSESSMENT AND PLAN / ED COURSE  I reviewed the triage vital signs and the nursing notes.    Patient here with complaints of fever, nausea and vomiting, right lower  quadrant abdominal pain.  The patient is on the cardiac monitor to evaluate for evidence of arrhythmia and/or significant heart rate changes.   DIFFERENTIAL DIAGNOSIS (includes but not limited to):   Appendicitis, colitis, diverticulitis, UTI, kidney stone, pyelonephritis, TOA, torsion, ectopic   Patient's presentation is most consistent with acute presentation with potential threat to life or bodily function.   PLAN: We will obtain CBC, CMP, urinalysis, urine pregnancy, CT of the abdomen pelvis.  Will give IV fluids, pain and nausea medicine.   MEDICATIONS GIVEN IN ED: Medications  amLODipine (NORVASC) tablet 5 mg (has no administration in time range)  FLUoxetine (PROZAC) capsule 40 mg (has no administration in time range)  norethindrone (MICRONOR) 0.35 MG tablet 0.35 mg (has no administration in time range)  fesoterodine (TOVIAZ) tablet 4 mg (has no administration in time range)   fluticasone (FLONASE) 50 MCG/ACT nasal spray 2 spray (has no administration in time range)  piperacillin-tazobactam (ZOSYN) IVPB 3.375 g (3.375 g Intravenous New Bag/Given 01/27/22 0254)  enoxaparin (LOVENOX) injection 40 mg (has no administration in time range)  0.9 %  sodium chloride infusion ( Intravenous New Bag/Given 01/27/22 0246)  acetaminophen (TYLENOL) tablet 650 mg (has no administration in time range)    Or  acetaminophen (TYLENOL) suppository 650 mg (has no administration in time range)  HYDROcodone-acetaminophen (NORCO/VICODIN) 5-325 MG per tablet 1-2 tablet (has no administration in time range)  morphine (PF) 4 MG/ML injection 4 mg (has no administration in time range)  ondansetron (ZOFRAN-ODT) disintegrating tablet 4 mg (has no administration in time range)    Or  ondansetron (ZOFRAN) injection 4 mg (has no administration in time range)  pantoprazole (PROTONIX) injection 40 mg (40 mg Intravenous Given 01/27/22 0252)  iohexol (OMNIPAQUE) 350 MG/ML injection 75 mL (75 mLs Intravenous Contrast Given 01/26/22 2244)  morphine (PF) 4 MG/ML injection 4 mg (4 mg Intravenous Given 01/27/22 0253)     ED COURSE: Patient's labs show mild leukocytosis of 10.9.  Normal electrolytes, renal function, LFTs.  Urine shows small amount of blood but she is on her menstrual cycle.  No other signs or symptoms of infection.  Pregnancy test is negative.  CT of the abdomen pelvis reviewed/interpreted by myself and radiologist and shows what appears to be possible early appendicitis.  This fits her clinical picture as well.  Will give antibiotics and will discuss with general surgery.   CONSULTS: Discussed with Dr. Peyton Najjar with general surgery.  He will see patient in the morning.  She is n.p.o. at this time.  Patient comfortable with this plan.   OUTSIDE RECORDS REVIEWED: Reviewed patient's last primary care note with Drema Dallas PA on 01/25/2022.       FINAL CLINICAL IMPRESSION(S) / ED DIAGNOSES    Final diagnoses:  Acute appendicitis, unspecified acute appendicitis type     Rx / DC Orders   ED Discharge Orders     None        Note:  This document was prepared using Dragon voice recognition software and may include unintentional dictation errors.   Azarius Lambson, Delice Bison, DO 01/27/22 0500

## 2022-01-27 NOTE — Plan of Care (Signed)

## 2022-01-27 NOTE — Anesthesia Preprocedure Evaluation (Signed)
Anesthesia Evaluation  Patient identified by MRN, date of birth, ID band Patient awake    Reviewed: Allergy & Precautions, NPO status , Patient's Chart, lab work & pertinent test results  History of Anesthesia Complications Negative for: history of anesthetic complications  Airway Mallampati: II  TM Distance: >3 FB Neck ROM: Full    Dental no notable dental hx.    Pulmonary neg sleep apnea, neg COPD, Patient abstained from smoking.Not current smoker,    Pulmonary exam normal breath sounds clear to auscultation       Cardiovascular Exercise Tolerance: Good METS: 3 - Mets hypertension, Pt. on medications (-) Past MI, (-) Cardiac Stents and (-) CHF Normal cardiovascular exam(-) dysrhythmias  Rhythm:Regular Rate:Normal     Neuro/Psych neg Seizures    GI/Hepatic negative GI ROS, Neg liver ROS, neg GERD  ,  Endo/Other  negative endocrine ROSneg diabetes  Renal/GU negative Renal ROS     Musculoskeletal   Abdominal   Peds  Hematology negative hematology ROS (+)   Anesthesia Other Findings Past Medical History: No date: Hypertension   Reproductive/Obstetrics                             Anesthesia Physical Anesthesia Plan  ASA: 2 and emergent  Anesthesia Plan: General   Post-op Pain Management:    Induction: Intravenous  PONV Risk Score and Plan: 3 and Ondansetron and Dexamethasone  Airway Management Planned: Oral ETT  Additional Equipment:   Intra-op Plan:   Post-operative Plan:   Informed Consent: I have reviewed the patients History and Physical, chart, labs and discussed the procedure including the risks, benefits and alternatives for the proposed anesthesia with the patient or authorized representative who has indicated his/her understanding and acceptance.     Dental Advisory Given  Plan Discussed with: Anesthesiologist, CRNA and Surgeon  Anesthesia Plan Comments:  (Patient consented for risks of anesthesia including but not limited to:  - adverse reactions to medications - risk of airway placement if required - damage to eyes, teeth, lips or other oral mucosa - nerve damage due to positioning  - sore throat or hoarseness - Damage to heart, brain, nerves, lungs, other parts of body or loss of life  Patient voiced understanding.)        Anesthesia Quick Evaluation

## 2022-01-27 NOTE — Anesthesia Procedure Notes (Signed)
Procedure Name: Intubation Date/Time: 01/27/2022 9:47 AM  Performed by: Garner Nash, CRNAPre-anesthesia Checklist: Patient identified, Emergency Drugs available, Suction available and Patient being monitored Patient Re-evaluated:Patient Re-evaluated prior to induction Oxygen Delivery Method: Circle system utilized Preoxygenation: Pre-oxygenation with 100% oxygen Induction Type: IV induction Laryngoscope Size: Mac and 3 Grade View: Grade II Tube type: Oral Tube size: 6.5 mm Number of attempts: 1 Placement Confirmation: ETT inserted through vocal cords under direct vision, positive ETCO2 and breath sounds checked- equal and bilateral Secured at: 20 cm Tube secured with: Tape Dental Injury: Teeth and Oropharynx as per pre-operative assessment

## 2022-01-27 NOTE — H&P (Signed)
SURGICAL CONSULTATION NOTE   HISTORY OF PRESENT ILLNESS (HPI):  51 y.o. female presented to [facility] ED for evaluation of abdominal pain. Patient reports started having abdominal pain 2 days ago.  Pain localized to the lower abdomen.  Pain is radiated to the right lower quadrant.  Pain is exacerbated by applying pressure.  No alleviating factors.  Patient endorses associated nausea and vomiting.  In the ED she was found with mild elevated leukocytosis.  No fever.  Stable vital signs.  Tender to palpation in the right lower quadrant.  CT scan of the abdomen and pelvis shows thickening of the appendix with minimal fat stranding.  There was consistent with early acute appendicitis.  I personally evaluated the images.  There was no free air or free fluid.  Surgery is consulted by Dr. Leonides Schanz in this context for evaluation and management of acute appendicitis.  PAST MEDICAL HISTORY (PMH):  Past Medical History:  Diagnosis Date   Hypertension      PAST SURGICAL HISTORY ([place]):  Past Surgical History:  Procedure Laterality Date   CESAREAN SECTION     COLONOSCOPY WITH PROPOFOL N/A 03/01/2021   Procedure: COLONOSCOPY WITH PROPOFOL;  Surgeon: Virgel Manifold, MD;  Location: [facility] ENDOSCOPY;  Service: Endoscopy;  Laterality: N/A;     MEDICATIONS:  Prior to Admission medications   Medication Sig Start Date End Date Taking? Authorizing Provider  amLODipine (NORVASC) 5 MG tablet TAKE 1 TABLET BY MOUTH DAILY. 12/28/21  Yes McDonough, Lauren K, PA-C  amoxicillin-clavulanate (AUGMENTIN) 875-125 MG tablet Take 1 tablet by mouth 2 (two) times daily. Take with food. 01/25/22  Yes McDonough, Lauren K, PA-C  FLUoxetine (PROZAC) 40 MG capsule TAKE 1 CAPSULE BY MOUTH EVERY DAY 06/12/21  Yes Gae Dry, MD  fluticasone (FLONASE) 50 MCG/ACT nasal spray Place 2 sprays into both nostrils daily. 03/02/20  Yes Boscia, Greer Ee, NP  norethindrone (MICRONOR) 0.35 MG tablet Take 1 tablet (0.35 mg total) by mouth daily.  06/12/21  Yes Gae Dry, MD  rosuvastatin (CRESTOR) 5 MG tablet Take 1 tablet (5 mg total) by mouth daily. 01/19/22  Yes McDonough, Lauren K, PA-C  tolterodine (DETROL LA) 4 MG 24 hr capsule Take 1 capsule (4 mg total) by mouth daily. 06/12/21  Yes Gae Dry, MD     ALLERGIES:  Allergies  Allergen Reactions   Eggs Or Egg-Derived Products Hives, Itching and Other (See Comments)    congetsion   Shellfish Allergy Other (See Comments)    Unknown says MD told her to never eat shellfish     SOCIAL HISTORY:  Social History   Socioeconomic History   Marital status: Married    Spouse name: Not on file   Number of children: Not on file   Years of education: Not on file   Highest education level: Not on file  Occupational History   Not on file  Tobacco Use   Smoking status: Never   Smokeless tobacco: Never  Vaping Use   Vaping Use: Never used  Substance and Sexual Activity   Alcohol use: Yes    Comment: ocassionally    Drug use: No   Sexual activity: Yes    Birth control/protection: Pill  Other Topics Concern   Not on file  Social History Narrative   Not on file   Social Determinants of Health   Financial Resource Strain: Not on file  Food Insecurity: Not on file  Transportation Needs: Not on file  Physical Activity: Not on file  Stress: Not on file  Social Connections: Not on file  Intimate Partner Violence: Not on file      FAMILY HISTORY:  Family History  Problem Relation Age of Onset   Diabetes Maternal Uncle    Colon cancer Maternal Grandfather    Cancer Paternal Grandfather    Anxiety disorder Mother    Depression Mother    Hypertension Father      REVIEW OF SYSTEMS:  Constitutional: denies weight loss, fever, chills, or sweats  Eyes: denies any other vision changes, history of eye injury  ENT: denies sore throat, hearing problems  Respiratory: denies shortness of breath, wheezing  Cardiovascular: denies chest pain, palpitations   Gastrointestinal: positive abdominal pain, nausea and vomiting Genitourinary: denies burning with urination or urinary frequency Musculoskeletal: denies any other joint pains or cramps  Skin: denies any other rashes or skin discolorations  Neurological: denies any other headache, dizziness, weakness  Psychiatric: denies any other depression, anxiety   All other review of systems were negative   VITAL SIGNS:  Temp:  [98.6 F (37 C)-99.3 F (37.4 C)] 99.3 F (37.4 C) (07/08 0535) Pulse Rate:  [69-98] 77 (07/08 0535) Resp:  [15-20] 16 (07/08 0535) BP: (130-138)/(66-80) 136/70 (07/08 0535) SpO2:  [98 %-100 %] 98 % (07/08 0535) Weight:  [71 kg-72 kg] 71 kg (07/08 0144)     Height: '5\' 2"'$  (157.5 cm) Weight: 71 kg BMI (Calculated): 28.62   INTAKE/OUTPUT:  This shift: No intake/output data recorded.  Last 2 shifts: '@IOLAST2SHIFTS'$ @   PHYSICAL EXAM:  Constitutional:  -- Normal body habitus  -- Awake, alert, and oriented x3  Eyes:  -- Pupils equally round and reactive to light  -- No scleral icterus  Ear, nose, and throat:  -- No jugular venous distension  Pulmonary:  -- No crackles  -- Equal breath sounds bilaterally -- Breathing non-labored at rest Cardiovascular:  -- S1, S2 present  -- No pericardial rubs Gastrointestinal:  -- Abdomen soft, tender to palpation in the right lower quadrant, non-distended, no guarding or rebound tenderness -- No abdominal masses appreciated, pulsatile or otherwise  Musculoskeletal and Integumentary:  -- Wounds or skin discoloration: None appreciated -- Extremities: B/L UE and LE FROM, hands and feet warm, no edema  Neurologic:  -- Motor function: intact and symmetric -- Sensation: intact and symmetric   Labs:     Latest Ref Rng & Units 01/26/2022    9:21 PM 01/16/2022   10:55 AM 01/04/2021   11:22 AM  CBC  WBC 4.0 - 10.5 K/uL 10.9  4.2  4.8   Hemoglobin 12.0 - 15.0 g/dL 12.9  12.6  13.2   Hematocrit 36.0 - 46.0 % 39.4  37.9  41.4    Platelets 150 - 400 K/uL 281  282  336       Latest Ref Rng & Units 01/26/2022    9:21 PM 01/16/2022   10:55 AM 01/04/2021   11:22 AM  CMP  Glucose 70 - 99 mg/dL 122  104  102   BUN 6 - 20 mg/dL '15  8  9   '$ Creatinine 0.44 - 1.00 mg/dL 0.77  0.70  0.74   Sodium 135 - 145 mmol/L 138  137  139   Potassium 3.5 - 5.1 mmol/L 3.7  4.2  4.3   Chloride 98 - 111 mmol/L 107  103  103   CO2 22 - 32 mmol/L '23  22  22   '$ Calcium 8.9 - 10.3 mg/dL 8.5  8.4  9.0  Total Protein 6.5 - 8.1 g/dL 7.1  6.5  6.9   Total Bilirubin 0.3 - 1.2 mg/dL 0.8  0.5  0.5   Alkaline Phos 38 - 126 U/L 44  51  64   AST 15 - 41 U/L '18  12  14   '$ ALT 0 - 44 U/L '13  9  11     '$ Imaging studies:  EXAM: CT ABDOMEN AND PELVIS WITH CONTRAST   TECHNIQUE: Multidetector CT imaging of the abdomen and pelvis was performed using the standard protocol following bolus administration of intravenous contrast.   RADIATION DOSE REDUCTION: This exam was performed according to the departmental dose-optimization program which includes automated exposure control, adjustment of the mA and/or kV according to patient size and/or use of iterative reconstruction technique.   CONTRAST:  29m OMNIPAQUE IOHEXOL 350 MG/ML SOLN   COMPARISON:  None Available.   FINDINGS: Lower chest: No acute abnormality.   Hepatobiliary: No focal liver abnormality is seen. No gallstones, gallbladder wall thickening, or biliary dilatation.   Pancreas: Unremarkable. No pancreatic ductal dilatation or surrounding inflammatory changes.   Spleen: Normal in size without focal abnormality.   Adrenals/Urinary Tract: Adrenal glands are normal. Kidneys show no hydronephrosis. Subcentimeter hypodensities too small to further characterize. The bladder is unremarkable.   Stomach/Bowel: The stomach is nonenlarged. No dilated small bowel. No acute bowel wall thickening. Fluid-filled nondilated loops of small bowel. Borderline to slightly enlarged appendix,  measures between 7 and 8 mm, no significant gas within the lumen. Not much Peri appendiceal stranding.   Vascular/Lymphatic: No significant vascular findings are present. No enlarged abdominal or pelvic lymph nodes.   Reproductive: Uterus and bilateral adnexa are unremarkable.   Other: Negative for pelvic effusion or free air. Small fat containing umbilical hernia.   Musculoskeletal: No acute osseous abnormality   IMPRESSION: 1. Findings are equivocal for appendicitis, the appendix is borderline to slightly enlarged up to 8 mm and without intraluminal gas, however not much periappendiceal soft tissue stranding is present. 2. Otherwise no CT evidence for acute intra-abdominal or pelvic abnormality     Electronically Signed   By: KDonavan FoilM.D.   On: 01/26/2022 22:58  Assessment/Plan:  51y.o. female with acute appendicitis, complicated by pertinent comorbidities including hypertension.  Patient with history, physical exam and images consistent with acute appendicitis. Patient oriented about diagnosis and surgical management as treatment. Patient oriented about goals of surgery and its risk including: bowel injury, infection, abscess, bleeding, leak from cecum, intestinal adhesions, bowel obstruction, fistula, injury to the ureter among others.  Patient understood and agreed to proceed with surgery. Will admit patient, already started on antibiotic therapy, will give IV hydration since patient is NPO and schedule to OR.   EArnold Long MD

## 2022-01-27 NOTE — Op Note (Signed)
Pre-op Diagnosis: Acute appendicitis   Post op Diagnosis: Acute appenditicis  Procedure: Robotic assisted laparoscopic appendectomy.  Anesthesia: GETA  Surgeon: Herbert Pun, MD, FACS  Wound Classification: clean contaminated  Specimen: Appendix  Complications: None  Estimated Blood Loss: 5 mL   Indications: Patient is a 51 y.o. female  presented with above right lower quadrant pain. CT scan shows enlarged appendix. Physical exam, symptoms of nausea, no appetite and imaging, discussed with patient about diagnostic laparoscopy with appendectomy vs observation as option. Patient elected with appendectomy     FIndings: 1.  Mild congested appendix 2. No peri-appendiceal abscess or phlegmon 3. Normal anatomy 4. Adequate hemostasis.     Description of procedure: The patient was placed on the operating table in the supine position. General anesthesia was induced. A time-out was completed verifying correct patient, procedure, site, positioning, and implant(s) and/or special equipment prior to beginning this procedure. The abdomen was prepped and draped in the usual sterile fashion.   Palmer's point located and Veress needle was inserted.  After confirming 2 clicks and a positive saline drop test, gas insufflation was initiated until the abdominal pressure was measured at 15 mmHg.  Afterwards, the Veress needle was removed and a 8 mm port was placed in left upper quadrant area using Optiview technique.  After local was infused, 3 additional incision on the left abdominal wall were made 5 cm apart.  An 12 mm port and two other 8 mm ports were placed under direct visualization.  No injuries from trocar placements were noted.  The table was placed in the Trendelenburg position with the right side elevated.  With the use of Tip up grasper, fenestrated bipolar and monopolar scissors, a congested appendix was identified and elevated.  Window created at base of appendix in the mesentery.     The mesoappendix was divided with combination of bipolar energy and monopolar scissors.  The base of the appendix was ligated with 3-0 V-Loc.  The appendix was divided with monopolar scissors.  A second layer of the 3 oh V-Loc was done over the appendiceal stump.  The appendiceal stump was examined and hemostasis noted. No other pathology was identified within pelvis. The 12 mm trocar removed and port site closed with PMI using 0 vicryl under direct vision. Remaining trocars were removed under direct vision. No bleeding was noted.The abdomen was allowed to collapse.  All skin incisions then closed with subcuticular sutures Monocryl 4-0.  Wounds then dressed with dermabond.  The patient tolerated the procedure well, awakened from anesthesia and was taken to the postanesthesia care unit in satisfactory condition.  Sponge count and instrument count correct at the end of the procedure.

## 2022-01-27 NOTE — Anesthesia Postprocedure Evaluation (Signed)
Anesthesia Post Note  Patient: Stephanie Ferrell  Procedure(s) Performed: XI ROBOTIC LAPAROSCOPIC ASSISTED APPENDECTOMY (Abdomen)  Patient location during evaluation: PACU Anesthesia Type: General Level of consciousness: awake and alert Pain management: pain level controlled Vital Signs Assessment: post-procedure vital signs reviewed and stable Respiratory status: spontaneous breathing, nonlabored ventilation, respiratory function stable and patient connected to nasal cannula oxygen Cardiovascular status: blood pressure returned to baseline and stable Postop Assessment: no apparent nausea or vomiting Anesthetic complications: no   No notable events documented.   Last Vitals:  Vitals:   01/27/22 1115 01/27/22 1135  BP: (!) 113/52 (!) 114/56  Pulse:  83  Resp:  17  Temp: 36.7 C 37.1 C  SpO2:  96%    Last Pain:  Vitals:   01/27/22 1200  TempSrc:   PainSc: 0-No pain                 Tonny Bollman

## 2022-01-28 ENCOUNTER — Encounter: Payer: Self-pay | Admitting: General Surgery

## 2022-01-28 MED ORDER — HYDROCODONE-ACETAMINOPHEN 5-325 MG PO TABS: 1.0000 | ORAL_TABLET | ORAL | 0 refills | Status: AC | PRN

## 2022-01-28 NOTE — Discharge Summary (Signed)
  Patient ID: Stephanie Ferrell MRN: [id number] DOB/AGE: June 17, 1971 51 y.o.  Admit date: 01/26/2022 Discharge date: 01/28/2022   Discharge Diagnoses:  Principal Problem:   Acute appendicitis with localized peritonitis   Procedures: Robotic assisted laparoscopic appendectomy  Hospital Course: Patient admitted with acute appendicitis.  She underwent robotic assisted laparoscopic appendectomy.  She tolerated the procedure well.  Patient without nausea after surgery.  Patient has been tolerating diet.  The pain has not been under control.  Patient ambulating.  Physical Exam Vitals reviewed.  Constitutional:      Appearance: She is well-developed.  HENT:     Head: Normocephalic.  Cardiovascular:     Rate and Rhythm: Normal rate and regular rhythm.  Pulmonary:     Effort: Pulmonary effort is normal.     Breath sounds: Normal breath sounds.  Abdominal:     General: Abdomen is flat. Bowel sounds are normal.     Palpations: Abdomen is soft.  Skin:    Capillary Refill: Capillary refill takes less than 2 seconds.  Neurological:     Mental Status: She is alert and oriented to person, place, and time.      Consults: None  Disposition: Discharge disposition: 01-Home or Self Care       Discharge Instructions     Diet - low sodium heart healthy   Complete by: As directed    Increase activity slowly   Complete by: As directed       Allergies as of 01/28/2022       Reactions   Eggs Or Egg-derived Products Hives, Itching, Other (See Comments)   congetsion   Shellfish Allergy Other (See Comments)   Unknown says MD told her to never eat shellfish        Medication List     TAKE these medications    amLODipine 5 MG tablet Commonly known as: NORVASC TAKE 1 TABLET BY MOUTH DAILY.   amoxicillin-clavulanate 875-125 MG tablet Commonly known as: AUGMENTIN Take 1 tablet by mouth 2 (two) times daily. Take with food.   FLUoxetine 40 MG capsule Commonly known as:  PROZAC TAKE 1 CAPSULE BY MOUTH EVERY DAY   fluticasone 50 MCG/ACT nasal spray Commonly known as: FLONASE Place 2 sprays into both nostrils daily.   HYDROcodone-acetaminophen 5-325 MG tablet Commonly known as: Norco Take 1 tablet by mouth every 4 (four) hours as needed for up to 3 days for moderate pain.   norethindrone 0.35 MG tablet Commonly known as: MICRONOR Take 1 tablet (0.35 mg total) by mouth daily.   rosuvastatin 5 MG tablet Commonly known as: Crestor Take 1 tablet (5 mg total) by mouth daily.   tolterodine 4 MG 24 hr capsule Commonly known as: DETROL LA Take 1 capsule (4 mg total) by mouth daily.        Follow-up Information     Herbert Pun, MD Follow up in 2 week(s).   Specialty: General Surgery Why: Follow up after appendectomy Contact information: [address] [place] [address] [phone]

## 2022-01-28 NOTE — Discharge Instructions (Signed)

## 2022-01-31 LAB — SURGICAL PATHOLOGY

## 2022-03-16 LAB — TSH+FREE T4
Free T4: 0.87 ng/dL (ref 0.82–1.77)
TSH: 0.967 u[IU]/mL (ref 0.450–4.500)

## 2022-03-19 ENCOUNTER — Ambulatory Visit: Payer: BC Managed Care – PPO | Admitting: Physician Assistant

## 2022-03-22 ENCOUNTER — Ambulatory Visit (INDEPENDENT_AMBULATORY_CARE_PROVIDER_SITE_OTHER): Payer: BC Managed Care – PPO | Admitting: Physician Assistant

## 2022-03-22 ENCOUNTER — Encounter: Payer: Self-pay | Admitting: Physician Assistant

## 2022-03-22 VITALS — BP 125/66 | HR 83 | Temp 97.6°F | Resp 16 | Ht 62.0 in | Wt 165.0 lb

## 2022-03-22 DIAGNOSIS — I10 Essential (primary) hypertension: Secondary | ICD-10-CM | POA: Diagnosis not present

## 2022-03-22 DIAGNOSIS — R7989 Other specified abnormal findings of blood chemistry: Secondary | ICD-10-CM | POA: Diagnosis not present

## 2022-03-22 NOTE — Progress Notes (Signed)
[facility] [place], [place] [address]  Internal MEDICINE  Office Visit Note  Patient Name: Stephanie Ferrell  [id number]  [id number]  Date of Service: 03/28/2022  Chief Complaint  Patient presents with   Follow-up   Hypertension    HPI Pt is here for routine follow up -Did have appendix removed since last visit. She is feeling much better now and had her follow up with surgeon already. 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 -BP stable  Current Medication: Outpatient Encounter Medications as of 03/22/2022  Medication Sig   amLODipine (NORVASC) 5 MG tablet TAKE 1 TABLET BY MOUTH DAILY.   FLUoxetine (PROZAC) 40 MG capsule TAKE 1 CAPSULE BY MOUTH EVERY DAY   fluticasone (FLONASE) 50 MCG/ACT nasal spray Place 2 sprays into both nostrils daily.   norethindrone (MICRONOR) 0.35 MG tablet Take 1 tablet (0.35 mg total) by mouth daily.   rosuvastatin (CRESTOR) 5 MG tablet Take 1 tablet (5 mg total) by mouth daily.   tolterodine (DETROL LA) 4 MG 24 hr capsule Take 1 capsule (4 mg total) by mouth daily.   [DISCONTINUED] amoxicillin-clavulanate (AUGMENTIN) 875-125 MG tablet Take 1 tablet by mouth 2 (two) times daily. Take with food.   No facility-administered encounter medications on file as of 03/22/2022.    Surgical History: Past Surgical History:  Procedure Laterality Date   CESAREAN SECTION     COLONOSCOPY WITH PROPOFOL N/A 03/01/2021   Procedure: COLONOSCOPY WITH PROPOFOL;  Surgeon: Virgel Manifold, MD;  Location: [facility] ENDOSCOPY;  Service: Endoscopy;  Laterality: N/A;   XI ROBOTIC LAPAROSCOPIC ASSISTED APPENDECTOMY N/A 01/27/2022   Procedure: XI ROBOTIC LAPAROSCOPIC ASSISTED APPENDECTOMY;  Surgeon: Herbert Pun, MD;  Location: [facility] ORS;   Service: General;  Laterality: N/A;    Medical History: Past Medical History:  Diagnosis Date   Hypertension     Family History: Family History  Problem Relation Age of Onset   Diabetes Maternal Uncle    Colon cancer Maternal Grandfather    Cancer Paternal Grandfather    Anxiety disorder Mother    Depression Mother    Hypertension Father     Social History   Socioeconomic History   Marital status: Married    Spouse name: Not on file   Number of children: Not on file   Years of education: Not on file   Highest education level: Not on file  Occupational History   Not on file  Tobacco Use   Smoking status: Never   Smokeless tobacco: Never  Vaping Use   Vaping Use: Never used  Substance and Sexual Activity   Alcohol use: Yes    Comment: ocassionally    Drug use: No   Sexual activity: Yes    Birth control/protection: Pill  Other Topics Concern   Not on file  Social History Narrative   Not on file   Social Determinants of Health   Financial Resource Strain: Not on file  Food Insecurity: Not on file  Transportation Needs: Not on file  Physical Activity: Not on file  Stress: Not on file  Social Connections: Not on file  Intimate Partner Violence: Not on file      Review of Systems  Constitutional:  Negative for chills, fatigue and unexpected weight change.  HENT:  Negative for congestion, postnasal drip, rhinorrhea, sneezing and sore throat.   Eyes:  Negative for redness.  Respiratory:  Negative for cough, chest tightness and shortness of breath.   Cardiovascular:  Negative for chest pain and palpitations.  Gastrointestinal:  Negative for abdominal pain, constipation, diarrhea, nausea and vomiting.  Genitourinary:  Negative for dysuria and frequency.  Musculoskeletal:  Negative for arthralgias, back pain, joint swelling and neck pain.  Skin:  Negative for rash.  Neurological: Negative.  Negative for tremors and numbness.  Hematological:  Negative for  adenopathy. Does not bruise/bleed easily.  Psychiatric/Behavioral:  Negative for behavioral problems (Depression), sleep disturbance and suicidal ideas. The patient is not nervous/anxious.     Vital Signs: BP 125/66   Pulse 83   Temp 97.6 F (36.4 C)   Resp 16   Ht '5\' 2"'$  (1.575 m)   Wt 165 lb (74.8 kg)   SpO2 99%   BMI 30.18 kg/m    Physical Exam Vitals and nursing note reviewed.  Constitutional:      General: She is not in acute distress.    Appearance: Normal appearance. She is well-developed. She is not diaphoretic.  HENT:     Head: Normocephalic and atraumatic.     Mouth/Throat:     Pharynx: No oropharyngeal exudate.  Eyes:     Pupils: Pupils are equal, round, and reactive to light.  Neck:     Thyroid: No thyromegaly.     Vascular: No JVD.     Trachea: No tracheal deviation.  Cardiovascular:     Rate and Rhythm: Normal rate and regular rhythm.     Heart sounds: Normal heart sounds. No murmur heard.    No friction rub. No gallop.  Pulmonary:     Effort: Pulmonary effort is normal.  Abdominal:     General: Bowel sounds are normal.     Palpations: Abdomen is soft.  Musculoskeletal:        General: Normal range of motion.     Cervical back: Normal range of motion and neck supple.  Lymphadenopathy:     Cervical: No cervical adenopathy.  Skin:    General: Skin is warm and dry.  Neurological:     Mental Status: She is alert and oriented to person, place, and time.     Cranial Nerves: No cranial nerve deficit.  Psychiatric:        Behavior: Behavior normal.        Thought Content: Thought content normal.        Judgment: Judgment normal.        Assessment/Plan: 1. Essential hypertension Stable, continue current medication  2. Abnormal thyroid blood test Labs back in normal range on repeat testing, will continue to monitor   General Counseling: Stephanie Ferrell understanding of the findings of todays visit and agrees with plan of treatment. I have  discussed any further diagnostic evaluation that may be needed or ordered today. We also reviewed her medications today. she has been encouraged to call the office with any questions or concerns that should arise related to todays visit.    No orders of the defined types were placed in this encounter.   No orders of the defined types were placed in this encounter.   This patient was seen by Drema Dallas, PA-C in collaboration with Dr. Clayborn Bigness as a part of collaborative care agreement.   Total time spent:30 Minutes Time spent includes review of chart, medications, test results, and follow up plan with the patient.  Dr Lavera Guise Internal medicine

## 2022-04-12 ENCOUNTER — Other Ambulatory Visit: Payer: Self-pay | Admitting: Physician Assistant

## 2022-04-12 DIAGNOSIS — I10 Essential (primary) hypertension: Secondary | ICD-10-CM

## 2022-04-27 ENCOUNTER — Other Ambulatory Visit: Payer: Self-pay | Admitting: Physician Assistant

## 2022-04-27 DIAGNOSIS — I10 Essential (primary) hypertension: Secondary | ICD-10-CM

## 2022-04-30 ENCOUNTER — Other Ambulatory Visit: Payer: Self-pay

## 2022-07-13 ENCOUNTER — Telehealth: Payer: Self-pay

## 2022-07-13 NOTE — Telephone Encounter (Signed)
Pt called  that  she went to urgent care for sinus infection and they gave her Amoxicillin and she finished yesterday still postal nasal drips advised her to take OTC Claritin and Flonase and take Mucinex if she not feeling better then call us back

## 2022-07-25 ENCOUNTER — Telehealth (INDEPENDENT_AMBULATORY_CARE_PROVIDER_SITE_OTHER): Payer: BC Managed Care – PPO | Admitting: Nurse Practitioner

## 2022-07-25 ENCOUNTER — Encounter: Payer: Self-pay | Admitting: Nurse Practitioner

## 2022-07-25 VITALS — Resp 16 | Ht 62.0 in | Wt 161.0 lb

## 2022-07-25 DIAGNOSIS — J0111 Acute recurrent frontal sinusitis: Secondary | ICD-10-CM | POA: Diagnosis not present

## 2022-07-25 MED ORDER — DOXYCYCLINE HYCLATE 100 MG PO TABS: 100.0000 mg | ORAL_TABLET | Freq: Two times a day (BID) | ORAL | 0 refills | Status: DC

## 2022-07-25 NOTE — Progress Notes (Signed)
[facility] [place], [place] [address]  Internal MEDICINE  Telephone Visit  Patient Name: Stephanie Ferrell  [id number]  [id number]  Date of Service: 07/25/2022  I connected with the patient at [place] by telephone and verified the patients identity using two identifiers.   I discussed the limitations, risks, security and privacy concerns of performing an evaluation and management service by telephone and the availability of in person appointments. I also discussed with the patient that there may be a patient responsible charge related to the service.  The patient expressed understanding and agrees to proceed.    Chief Complaint  Patient presents with   Telephone Screen    Covid test neg.    Telephone Assessment   Sore Throat    HPI Cyndal presents for a telehealth virtual visit for sore throat. --covid test negative Onset of symptoms was last Saturday, symptoms became worse. Was treated for sinus infection in mid to late December with amoxicillin but her symptoms never fully resolved.  Associated symptoms are nasal congestion, sinus pressure, runny nose    Current Medication: Outpatient Encounter Medications as of 07/25/2022  Medication Sig   amLODipine (NORVASC) 5 MG tablet TAKE ONE TABLET BY MOUTH EVERY DAY   doxycycline (VIBRA-TABS) 100 MG tablet Take 1 tablet (100 mg total) by mouth 2 (two) times daily. Take with food   FLUoxetine (PROZAC) 40 MG capsule TAKE 1 CAPSULE BY MOUTH EVERY DAY   fluticasone (FLONASE) 50 MCG/ACT nasal spray Place 2 sprays into both nostrils daily.   norethindrone (MICRONOR) 0.35 MG tablet Take 1 tablet (0.35 mg total) by mouth daily.   rosuvastatin (CRESTOR) 5 MG tablet Take 1 tablet (5 mg total) by mouth daily.   tolterodine (DETROL LA) 4 MG 24 hr capsule Take 1 capsule (4 mg total) by mouth daily.   No facility-administered encounter medications on file as of 07/25/2022.    Surgical History: Past Surgical History:   Procedure Laterality Date   CESAREAN SECTION     COLONOSCOPY WITH PROPOFOL N/A 03/01/2021   Procedure: COLONOSCOPY WITH PROPOFOL;  Surgeon: Virgel Manifold, MD;  Location: [facility];  Service: Endoscopy;  Laterality: N/A;   XI ROBOTIC LAPAROSCOPIC ASSISTED APPENDECTOMY N/A 01/27/2022   Procedure: XI ROBOTIC LAPAROSCOPIC ASSISTED APPENDECTOMY;  Surgeon: Herbert Pun, MD;  Location: [facility] ORS;  Service: General;  Laterality: N/A;    Medical History: Past Medical History:  Diagnosis Date   Hypertension     Family History: Family History  Problem Relation Age of Onset   Diabetes Maternal Uncle    Colon cancer Maternal Grandfather    Cancer Paternal Grandfather    Anxiety disorder Mother    Depression Mother    Hypertension Father     Social History   Socioeconomic History   Marital status: Married    Spouse name: Not on file   Number of children: Not on file   Years of education: Not on file   Highest education level: Not on file  Occupational History   Not on file  Tobacco Use   Smoking status: Never   Smokeless tobacco: Never  Vaping Use   Vaping Use: Never used  Substance and Sexual Activity   Alcohol use: Yes    Comment: ocassionally    Drug use: No   Sexual activity: Yes    Birth control/protection: Pill  Other Topics Concern   Not on file  Social History Narrative   Not on file   Social Determinants of  Health   Financial Resource Strain: Not on file  Food Insecurity: Not on file  Transportation Needs: Not on file  Physical Activity: Not on file  Stress: Not on file  Social Connections: Not on file  Intimate Partner Violence: Not on file      Review of Systems  Constitutional:  Negative for chills, fatigue and fever.  HENT:  Positive for congestion, postnasal drip, rhinorrhea, sinus pressure, sinus pain and sore throat. Negative for ear pain.   Respiratory: Negative.  Negative for cough, chest tightness, shortness of breath and  wheezing.   Cardiovascular: Negative.  Negative for chest pain and palpitations.  Musculoskeletal:  Negative for myalgias.  Neurological:  Negative for headaches.    Vital Signs: Resp 16   Ht '5\' 2"'$  (1.575 m)   Wt 161 lb (73 kg)   BMI 29.45 kg/m    Observation/Objective: She is alert and oriented and engages in conversation appropriately. She does not sound as though she is in any acute distress over telephone call.     Assessment/Plan: 1. Acute recurrent frontal sinusitis Doxycycline prescribed, if symptoms do not resolve after this antibiotic, next step should be ENT referral for further evaluation.  - doxycycline (VIBRA-TABS) 100 MG tablet; Take 1 tablet (100 mg total) by mouth 2 (two) times daily. Take with food  Dispense: 20 tablet; Refill: 0   General Counseling: Neddie verbalizes understanding of the findings of today's phone visit and agrees with plan of treatment. I have discussed any further diagnostic evaluation that may be needed or ordered today. We also reviewed her medications today. she has been encouraged to call the office with any questions or concerns that should arise related to todays visit.  No follow-ups on file.   No orders of the defined types were placed in this encounter.   Meds ordered this encounter  Medications   doxycycline (VIBRA-TABS) 100 MG tablet    Sig: Take 1 tablet (100 mg total) by mouth 2 (two) times daily. Take with food    Dispense:  20 tablet    Refill:  0    Time spent:10 Minutes Time spent with patient included reviewing progress notes, labs, imaging studies, and discussing plan for follow up.  [place] Controlled Substance Database was reviewed by me for overdose risk score (ORS) if appropriate.  This patient was seen by Jonetta Osgood, FNP-C in collaboration with Dr. Clayborn Bigness as a part of collaborative care agreement.  Taelyr Jantz R. Valetta Fuller, MSN, FNP-C Internal medicine

## 2022-09-17 ENCOUNTER — Ambulatory Visit: Payer: BC Managed Care – PPO | Admitting: Physician Assistant

## 2022-09-20 ENCOUNTER — Encounter: Payer: Self-pay | Admitting: Physician Assistant

## 2022-09-20 ENCOUNTER — Ambulatory Visit (INDEPENDENT_AMBULATORY_CARE_PROVIDER_SITE_OTHER): Payer: BC Managed Care – PPO | Admitting: Physician Assistant

## 2022-09-20 VITALS — BP 132/75 | HR 77 | Temp 98.0°F | Resp 16 | Ht 62.0 in | Wt 169.0 lb

## 2022-09-20 DIAGNOSIS — E782 Mixed hyperlipidemia: Secondary | ICD-10-CM

## 2022-09-20 DIAGNOSIS — R5383 Other fatigue: Secondary | ICD-10-CM

## 2022-09-20 DIAGNOSIS — E538 Deficiency of other specified B group vitamins: Secondary | ICD-10-CM

## 2022-09-20 DIAGNOSIS — F411 Generalized anxiety disorder: Secondary | ICD-10-CM

## 2022-09-20 DIAGNOSIS — E559 Vitamin D deficiency, unspecified: Secondary | ICD-10-CM

## 2022-09-20 DIAGNOSIS — R0982 Postnasal drip: Secondary | ICD-10-CM | POA: Diagnosis not present

## 2022-09-20 DIAGNOSIS — I10 Essential (primary) hypertension: Secondary | ICD-10-CM

## 2022-09-20 DIAGNOSIS — R946 Abnormal results of thyroid function studies: Secondary | ICD-10-CM

## 2022-09-20 MED ORDER — TETANUS-DIPHTH-ACELL PERTUSSIS 5-2-15.5 LF-MCG/0.5 IM SUSP: 0.5000 mL | Freq: Once | INTRAMUSCULAR | 0 refills | Status: AC

## 2022-09-20 MED ORDER — SHINGRIX 50 MCG/0.5ML IM SUSR: 0.5000 mL | Freq: Once | INTRAMUSCULAR | 0 refills | Status: AC

## 2022-09-20 NOTE — Progress Notes (Signed)
[facility] [place], [place] [address]  Internal MEDICINE  Office Visit Note  Patient Name: Stephanie Ferrell  [id number]  [id number]  Date of Service: 09/25/2022  Chief Complaint  Patient presents with   Follow-up   Hypertension   Quality Metric Gaps    TDAP and Shingles    HPI Pt is here for routine follow up -Sinus congestion started a few days ago. Sore throat at night.   -Claritin OTC, sudafed, not taken mucinex -A little cough. No wheezing or SOB. 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  -Something at work may be contributing as her coworkers have all been have similar respiratory concerns on and off for awhile now. She was treated with 2 rounds of ABX in Dec-Jan for similar symptoms. -Due for shingles and tdap vaccines and will have these done -Will have fasting labs prior to CPE  Current Medication: Outpatient Encounter Medications as of 09/20/2022  Medication Sig   amLODipine (NORVASC) 5 MG tablet TAKE ONE TABLET BY MOUTH EVERY DAY   doxycycline (VIBRA-TABS) 100 MG tablet Take 1 tablet (100 mg total) by mouth 2 (two) times daily. Take with food   FLUoxetine (PROZAC) 40 MG capsule TAKE 1 CAPSULE BY MOUTH EVERY DAY   fluticasone (FLONASE) 50 MCG/ACT nasal spray Place 2 sprays into both nostrils daily.   norethindrone (MICRONOR) 0.35 MG tablet Take 1 tablet (0.35 mg total) by mouth daily.   rosuvastatin (CRESTOR) 5 MG tablet Take 1 tablet (5 mg total) by mouth daily.   tolterodine (DETROL LA) 4 MG 24 hr capsule Take 1 capsule (4 mg total) by mouth daily.   [DISCONTINUED] Tdap (ADACEL) 11-21-13.5 LF-MCG/0.5 injection Inject 0.5 mLs into the muscle once.   [DISCONTINUED] Zoster Vaccine Adjuvanted [facility]) injection Inject 0.5 mLs into the muscle once.   [EXPIRED] Tdap (ADACEL) 11-21-13.5 LF-MCG/0.5  injection Inject 0.5 mLs into the muscle once for 1 dose.   [EXPIRED] Zoster Vaccine Adjuvanted [facility]) injection Inject 0.5 mLs into the muscle once for 1 dose.   No facility-administered encounter medications on file as of 09/20/2022.    Surgical History: Past Surgical History:  Procedure Laterality Date   CESAREAN SECTION     COLONOSCOPY WITH PROPOFOL N/A 03/01/2021   Procedure: COLONOSCOPY WITH PROPOFOL;  Surgeon: Virgel Manifold, MD;  Location: [facility] END[facility];  Service: Endoscopy;  Laterality: N/A;   XI ROBOTIC LAPAROSCOPIC ASSISTED APPENDECTOMY N/A 01/27/2022   Procedure: XI ROBOTIC LAPAROSCOPIC ASSISTED APPENDECTOMY;  Surgeon: Herbert Pun, MD;  Location: [facility] ORS;  Service: General;  Laterality: N/A;    Medical History: Past Medical History:  Diagnosis Date   Hypertension     Family History: Family History  Problem Relation Age of Onset   Diabetes Maternal Uncle    Colon cancer Maternal Grandfather    Cancer Paternal Grandfather    Anxiety disorder Mother    Depression Mother    Hypertension Father     Social History   Socioeconomic History   Marital status: Married    Spouse name: Not on file   Number of children: Not on file   Years of education: Not on file   Highest education level: Not on file  Occupational History   Not on file  Tobacco Use   Smoking status: Never   Smokeless tobacco: Never  Vaping Use  Vaping Use: Never used  Substance and Sexual Activity   Alcohol use: Yes    Comment: ocassionally    Drug use: No   Sexual activity: Yes    Birth control/protection: Pill  Other Topics Concern   Not on file  Social History Narrative   Not on file   Social Determinants of Health   Financial Resource Strain: Not on file  Food Insecurity: Not on file  Transportation Needs: Not on file  Physical Activity: Not on file  Stress: Not on file  Social Connections: Not on file  Intimate Partner Violence: Not on file      Review  of Systems  Constitutional:  Negative for chills, fatigue and fever.  HENT:  Positive for congestion, postnasal drip and sore throat.   Respiratory:  Positive for cough. Negative for chest tightness, shortness of breath and wheezing.   Cardiovascular: Negative.  Negative for chest pain and palpitations.  Genitourinary:  Negative for dysuria.  Musculoskeletal:  Negative for myalgias.  Neurological:  Negative for headaches.  Psychiatric/Behavioral:  Negative for self-injury and suicidal ideas. The patient is not nervous/anxious.     Vital Signs: BP 132/75   Pulse 77   Temp 98 F (36.7 C)   Resp 16   Ht '5\' 2"'$  (1.575 m)   Wt 169 lb (76.7 kg)   SpO2 98%   BMI 30.91 kg/m    Physical Exam Vitals and nursing note reviewed.  Constitutional:      General: She is not in acute distress.    Appearance: Normal appearance. She is well-developed. She is not diaphoretic.  HENT:     Head: Normocephalic and atraumatic.     Mouth/Throat:     Pharynx: No oropharyngeal exudate.  Eyes:     Pupils: Pupils are equal, round, and reactive to light.  Neck:     Thyroid: No thyromegaly.     Vascular: No JVD.     Trachea: No tracheal deviation.  Cardiovascular:     Rate and Rhythm: Normal rate and regular rhythm.     Heart sounds: Normal heart sounds. No murmur heard.    No friction rub. No gallop.  Pulmonary:     Effort: Pulmonary effort is normal.  Abdominal:     General: Bowel sounds are normal.     Palpations: Abdomen is soft.  Musculoskeletal:        General: Normal range of motion.     Cervical back: Normal range of motion and neck supple.  Lymphadenopathy:     Cervical: No cervical adenopathy.  Skin:    General: Skin is warm and dry.  Neurological:     Mental Status: She is alert and oriented to person, place, and time.     Cranial Nerves: No cranial nerve deficit.  Psychiatric:        Behavior: Behavior normal.        Thought Content: Thought content normal.        Judgment:  Judgment normal.        Assessment/Plan: 1. Essential hypertension Well controlled, continue current medication  2. Postnasal drip Will continue antihistamine and start on flonase and mucinex. Advised to drink warm tea with honey or try lozenges to help soothe throat. If worsening may call back for possible ABX. May also follow up with ENT as she has had numerous sinus infections requiring ABX recently  3. GAD (generalized anxiety disorder) Stable, continue prozac as before  4. Mixed hyperlipidemia - Lipid Panel With LDL/HDL Ratio  5. Vitamin D deficiency - VITAMIN D 25 Hydroxy (Vit-D Deficiency, Fractures)  6. Abnormal thyroid exam - TSH + free T4  7. B12 deficiency - B12 and Folate Panel  8. Other fatigue - CBC w/Diff/Platelet - Comprehensive metabolic panel - TSH + free T4 - Lipid Panel With LDL/HDL Ratio - VITAMIN D 25 Hydroxy (Vit-D Deficiency, Fractures) - B12 and Folate Panel - Fe+TIBC+Fer   General Counseling: Stephanie Ferrell verbalizes understanding of the findings of todays visit and agrees with plan of treatment. I have discussed any further diagnostic evaluation that may be needed or ordered today. We also reviewed her medications today. she has been encouraged to call the office with any questions or concerns that should arise related to todays visit.    Orders Placed This Encounter  Procedures   CBC w/Diff/Platelet   Comprehensive metabolic panel   TSH + free T4   Lipid Panel With LDL/HDL Ratio   VITAMIN D 25 Hydroxy (Vit-D Deficiency, Fractures)   B12 and Folate Panel   Fe+TIBC+Fer    Meds ordered this encounter  Medications   Zoster Vaccine Adjuvanted [facility]) injection    Sig: Inject 0.5 mLs into the muscle once for 1 dose.    Dispense:  0.5 mL    Refill:  0   Tdap (ADACEL) 11-21-13.5 LF-MCG/0.5 injection    Sig: Inject 0.5 mLs into the muscle once for 1 dose.    Dispense:  0.5 mL    Refill:  0    This patient was seen by Drema Dallas,  PA-C in collaboration with Dr. Clayborn Bigness as a part of collaborative care agreement.   Total time spent:30 Minutes Time spent includes review of chart, medications, test results, and follow up plan with the patient.      Dr Lavera Guise Internal medicine

## 2022-12-01 ENCOUNTER — Other Ambulatory Visit: Payer: Self-pay | Admitting: Physician Assistant

## 2022-12-01 DIAGNOSIS — I10 Essential (primary) hypertension: Secondary | ICD-10-CM

## 2023-01-17 LAB — LIPID PANEL WITH LDL/HDL RATIO
Cholesterol, Total: 190 mg/dL (ref 100–199)
HDL: 58 mg/dL (ref >39)
LDL Chol Calc (NIH): 120 mg/dL — ABNORMAL HIGH (ref 0–99)
LDL/HDL Ratio: 2.1 ratio (ref 0.0–3.2)
Triglycerides: 62 mg/dL (ref 0–149)
VLDL Cholesterol Cal: 12 mg/dL (ref 5–40)

## 2023-01-17 LAB — CBC WITH DIFFERENTIAL/PLATELET
Basophils Absolute: 0 10*3/uL (ref 0.0–0.2)
Basos: 1 %
EOS (ABSOLUTE): 0.2 10*3/uL (ref 0.0–0.4)
Eos: 3 %
Hematocrit: 38.6 % (ref 34.0–46.6)
Hemoglobin: 12.5 g/dL (ref 11.1–15.9)
Immature Grans (Abs): 0 10*3/uL (ref 0.0–0.1)
Immature Granulocytes: 0 %
Lymphocytes Absolute: 1.5 10*3/uL (ref 0.7–3.1)
Lymphs: 34 %
MCH: 28.7 pg (ref 26.6–33.0)
MCHC: 32.4 g/dL (ref 31.5–35.7)
MCV: 89 fL (ref 79–97)
Monocytes Absolute: 0.5 10*3/uL (ref 0.1–0.9)
Monocytes: 10 %
Neutrophils Absolute: 2.2 10*3/uL (ref 1.4–7.0)
Neutrophils: 52 %
Platelets: 295 10*3/uL (ref 150–450)
RBC: 4.35 x10E6/uL (ref 3.77–5.28)
RDW: 12.5 % (ref 11.7–15.4)
WBC: 4.4 10*3/uL (ref 3.4–10.8)

## 2023-01-17 LAB — COMPREHENSIVE METABOLIC PANEL
ALT: 10 IU/L (ref 0–32)
AST: 13 IU/L (ref 0–40)
Albumin: 4.3 g/dL (ref 3.8–4.9)
Alkaline Phosphatase: 63 IU/L (ref 44–121)
BUN/Creatinine Ratio: 14 (ref 9–23)
BUN: 10 mg/dL (ref 6–24)
Bilirubin Total: 0.4 mg/dL (ref 0.0–1.2)
CO2: 23 mmol/L (ref 20–29)
Calcium: 8.8 mg/dL (ref 8.7–10.2)
Chloride: 105 mmol/L (ref 96–106)
Creatinine, Ser: 0.73 mg/dL (ref 0.57–1.00)
Globulin, Total: 2.3 g/dL (ref 1.5–4.5)
Glucose: 91 mg/dL (ref 70–99)
Potassium: 4.4 mmol/L (ref 3.5–5.2)
Sodium: 139 mmol/L (ref 134–144)
Total Protein: 6.6 g/dL (ref 6.0–8.5)
eGFR: 100 mL/min/{1.73_m2} (ref >59)

## 2023-01-17 LAB — IRON,TIBC AND FERRITIN PANEL
Ferritin: 15 ng/mL (ref 15–150)
Iron Saturation: 26 % (ref 15–55)
Iron: 83 ug/dL (ref 27–159)
Total Iron Binding Capacity: 323 ug/dL (ref 250–450)
UIBC: 240 ug/dL (ref 131–425)

## 2023-01-17 LAB — TSH+FREE T4
Free T4: 0.94 ng/dL (ref 0.82–1.77)
TSH: 1.34 u[IU]/mL (ref 0.450–4.500)

## 2023-01-17 LAB — VITAMIN D 25 HYDROXY (VIT D DEFICIENCY, FRACTURES): Vit D, 25-Hydroxy: 29.7 ng/mL — ABNORMAL LOW (ref 30.0–100.0)

## 2023-01-17 LAB — B12 AND FOLATE PANEL
Folate: 13.8 ng/mL (ref >3.0)
Vitamin B-12: 396 pg/mL (ref 232–1245)

## 2023-01-21 ENCOUNTER — Encounter: Payer: Self-pay | Admitting: Physician Assistant

## 2023-01-21 ENCOUNTER — Other Ambulatory Visit: Payer: Self-pay

## 2023-01-21 ENCOUNTER — Ambulatory Visit (INDEPENDENT_AMBULATORY_CARE_PROVIDER_SITE_OTHER): Payer: BC Managed Care – PPO | Admitting: Physician Assistant

## 2023-01-21 VITALS — BP 129/76 | HR 70 | Temp 97.9°F | Resp 16 | Ht 62.0 in | Wt 171.0 lb

## 2023-01-21 DIAGNOSIS — E782 Mixed hyperlipidemia: Secondary | ICD-10-CM

## 2023-01-21 DIAGNOSIS — I10 Essential (primary) hypertension: Secondary | ICD-10-CM

## 2023-01-21 DIAGNOSIS — R300 Dysuria: Secondary | ICD-10-CM

## 2023-01-21 DIAGNOSIS — F411 Generalized anxiety disorder: Secondary | ICD-10-CM

## 2023-01-21 DIAGNOSIS — E538 Deficiency of other specified B group vitamins: Secondary | ICD-10-CM | POA: Diagnosis not present

## 2023-01-21 DIAGNOSIS — Z0001 Encounter for general adult medical examination with abnormal findings: Secondary | ICD-10-CM

## 2023-01-21 DIAGNOSIS — E559 Vitamin D deficiency, unspecified: Secondary | ICD-10-CM

## 2023-01-21 DIAGNOSIS — Z01419 Encounter for gynecological examination (general) (routine) without abnormal findings: Secondary | ICD-10-CM

## 2023-01-21 MED ORDER — TETANUS-DIPHTH-ACELL PERTUSSIS 5-2.5-18.5 LF-MCG/0.5 IM SUSY
0.5000 mL | PREFILLED_SYRINGE | Freq: Once | INTRAMUSCULAR | 0 refills | Status: DC
Start: 2023-01-21 — End: 2023-01-21

## 2023-01-21 MED ORDER — TETANUS-DIPHTH-ACELL PERTUSSIS 5-2.5-18.5 LF-MCG/0.5 IM SUSY: 0.5000 mL | PREFILLED_SYRINGE | Freq: Once | INTRAMUSCULAR | 0 refills | Status: AC

## 2023-01-21 MED ORDER — CYANOCOBALAMIN 1000 MCG/ML IJ SOLN
1000.0000 ug | Freq: Once | INTRAMUSCULAR | Status: AC
  Administered 2023-01-21: 1000 ug via INTRAMUSCULAR

## 2023-01-21 MED ORDER — ZOSTER VAC RECOMB ADJUVANTED 50 MCG/0.5ML IM SUSR
0.5000 mL | Freq: Once | INTRAMUSCULAR | 0 refills | Status: DC
Start: 2023-01-21 — End: 2023-01-21

## 2023-01-21 MED ORDER — ZOSTER VAC RECOMB ADJUVANTED 50 MCG/0.5ML IM SUSR: 0.5000 mL | Freq: Once | INTRAMUSCULAR | 0 refills | Status: AC

## 2023-01-21 NOTE — Progress Notes (Signed)
[facility] [address] [place], [place] [address]  Internal MEDICINE  Office Visit Note  Patient Name: Stephanie Ferrell  [id number]  [id number]  Date of Service: 01/21/2023  Chief Complaint  Patient presents with   Annual Exam   Hypertension     HPI Pt is here for routine health maintenance examination and has no new concerns today -Taking crestor 2x per week and cholesterol is about the same-- will increase to every other day, previously had side effects with daily dosing. May consider fish oil supplement as well -Vit D is low and will start on Vit D supplement -B12 also low and patient is fatigued, will start injections -Will go for Shingles shots and tdap -Seeing OBGYN for pap and mammogram and will schedule -BP stable  Current Medication: Outpatient Encounter Medications as of 01/21/2023  Medication Sig   amLODipine (NORVASC) 5 MG tablet TAKE ONE TABLET BY MOUTH EVERY DAY   FLUoxetine (PROZAC) 40 MG capsule TAKE 1 CAPSULE BY MOUTH EVERY DAY   fluticasone (FLONASE) 50 MCG/ACT nasal spray Place 2 sprays into both nostrils daily.   norethindrone (MICRONOR) 0.35 MG tablet Take 1 tablet (0.35 mg total) by mouth daily.   rosuvastatin (CRESTOR) 5 MG tablet Take 1 tablet (5 mg total) by mouth daily.   tolterodine (DETROL LA) 4 MG 24 hr capsule Take 1 capsule (4 mg total) by mouth daily.   [DISCONTINUED] doxycycline (VIBRA-TABS) 100 MG tablet Take 1 tablet (100 mg total) by mouth 2 (two) times daily. Take with food   [DISCONTINUED] Tdap (BOOSTRIX) 5-2.5-18.5 LF-MCG/0.5 injection Inject 0.5 mLs into the muscle once.   [DISCONTINUED] Zoster Vaccine Adjuvanted [facility]) injection Inject 0.5 mLs into the muscle once.   Tdap (BOOSTRIX) 5-2.5-18.5 LF-MCG/0.5 injection Inject 0.5 mLs into the muscle once for 1 dose.   Zoster Vaccine Adjuvanted [facility]) injection Inject 0.5 mLs into the muscle once for 1 dose.   [EXPIRED] cyanocobalamin (VITAMIN B12) injection 1,000 mcg     No facility-administered encounter medications on file as of 01/21/2023.    Surgical History: Past Surgical History:  Procedure Laterality Date   CESAREAN SECTION     COLONOSCOPY WITH PROPOFOL N/A 03/01/2021   Procedure: COLONOSCOPY WITH PROPOFOL;  Surgeon: Pasty Spillers, MD;  Location: [facility] ENDOSCOPY;  Service: Endoscopy;  Laterality: N/A;   XI ROBOTIC LAPAROSCOPIC ASSISTED APPENDECTOMY N/A 01/27/2022   Procedure: XI ROBOTIC LAPAROSCOPIC ASSISTED APPENDECTOMY;  Surgeon: Carolan Shiver, MD;  Location: [facility] ORS;  Service: General;  Laterality: N/A;    Medical History: Past Medical History:  Diagnosis Date   Hypertension     Family History: Family History  Problem Relation Age of Onset   Diabetes Maternal Uncle    Colon cancer Maternal Grandfather    Cancer Paternal Grandfather    Anxiety disorder Mother    Depression Mother    Hypertension Father       Review of Systems  Constitutional:  Positive for fatigue. Negative for chills and unexpected weight change.  HENT:  Negative for congestion, postnasal drip, rhinorrhea, sneezing and sore throat.   Eyes:  Negative for redness.  Respiratory:  Negative for cough, chest tightness and shortness of breath.   Cardiovascular:  Negative for chest pain and palpitations.  Gastrointestinal:  Negative for abdominal pain, constipation, diarrhea, nausea and vomiting.  Genitourinary:  Negative for dysuria and frequency.  Musculoskeletal:  Negative for arthralgias, back pain, joint swelling and neck pain.  Skin:  Negative for rash.  Neurological: Negative.  Negative for  tremors and numbness.  Hematological:  Negative for adenopathy. Does not bruise/bleed easily.  Psychiatric/Behavioral:  Negative for behavioral problems (Depression), sleep disturbance and suicidal ideas. The patient is not nervous/anxious.      Vital Signs: BP 129/76   Pulse 70   Temp 97.9 F (36.6 C)   Resp 16   Ht 5\' 2"  (1.575 m)   Wt 171 lb (77.6  kg)   SpO2 97%   BMI 31.28 kg/m    Physical Exam Vitals and nursing note reviewed.  Constitutional:      General: She is not in acute distress.    Appearance: Normal appearance. She is well-developed. She is not diaphoretic.  HENT:     Head: Normocephalic and atraumatic.     Mouth/Throat:     Pharynx: No oropharyngeal exudate.  Eyes:     Pupils: Pupils are equal, round, and reactive to light.  Neck:     Thyroid: No thyromegaly.     Vascular: No JVD.     Trachea: No tracheal deviation.  Cardiovascular:     Rate and Rhythm: Normal rate and regular rhythm.     Heart sounds: Normal heart sounds. No murmur heard.    No friction rub. No gallop.  Pulmonary:     Effort: Pulmonary effort is normal.  Chest:     Chest wall: No tenderness.  Breasts:    Right: Normal. No mass.     Left: Normal. No mass.  Abdominal:     General: Bowel sounds are normal.     Palpations: Abdomen is soft.     Tenderness: There is no abdominal tenderness.  Musculoskeletal:        General: Normal range of motion.     Cervical back: Normal range of motion and neck supple.  Lymphadenopathy:     Cervical: No cervical adenopathy.  Skin:    General: Skin is warm and dry.  Neurological:     Mental Status: She is alert and oriented to person, place, and time.     Cranial Nerves: No cranial nerve deficit.  Psychiatric:        Behavior: Behavior normal.        Thought Content: Thought content normal.        Judgment: Judgment normal.      LABS: Recent Results (from the past 2160 hour(s))  CBC w/Diff/Platelet     Status: None   Collection Time: 01/16/23 10:29 AM  Result Value Ref Range   WBC 4.4 3.4 - 10.8 x10E3/uL   RBC 4.35 3.77 - 5.28 x10E6/uL   Hemoglobin 12.5 11.1 - 15.9 g/dL   Hematocrit 65.7 84.6 - 46.6 %   MCV 89 79 - 97 fL   MCH 28.7 26.6 - 33.0 pg   MCHC 32.4 31.5 - 35.7 g/dL   RDW 96.2 95.2 - 84.1 %   Platelets 295 150 - 450 x10E3/uL   Neutrophils 52 Not Estab. %   Lymphs 34 Not  Estab. %   Monocytes 10 Not Estab. %   Eos 3 Not Estab. %   Basos 1 Not Estab. %   Neutrophils Absolute 2.2 1.4 - 7.0 x10E3/uL   Lymphocytes Absolute 1.5 0.7 - 3.1 x10E3/uL   Monocytes Absolute 0.5 0.1 - 0.9 x10E3/uL   EOS (ABSOLUTE) 0.2 0.0 - 0.4 x10E3/uL   Basophils Absolute 0.0 0.0 - 0.2 x10E3/uL   Immature Granulocytes 0 Not Estab. %   Immature Grans (Abs) 0.0 0.0 - 0.1 x10E3/uL  Comprehensive metabolic panel  Status: None   Collection Time: 01/16/23 10:29 AM  Result Value Ref Range   Glucose 91 70 - 99 mg/dL   BUN 10 6 - 24 mg/dL   Creatinine, Ser 1.61 0.57 - 1.00 mg/dL   eGFR 096 >04 VW/UJW/1.19   BUN/Creatinine Ratio 14 9 - 23   Sodium 139 134 - 144 mmol/L   Potassium 4.4 3.5 - 5.2 mmol/L   Chloride 105 96 - 106 mmol/L   CO2 23 20 - 29 mmol/L   Calcium 8.8 8.7 - 10.2 mg/dL   Total Protein 6.6 6.0 - 8.5 g/dL   Albumin 4.3 3.8 - 4.9 g/dL   Globulin, Total 2.3 1.5 - 4.5 g/dL   Bilirubin Total 0.4 0.0 - 1.2 mg/dL   Alkaline Phosphatase 63 44 - 121 IU/L   AST 13 0 - 40 IU/L   ALT 10 0 - 32 IU/L  TSH + free T4     Status: None   Collection Time: 01/16/23 10:29 AM  Result Value Ref Range   TSH 1.340 0.450 - 4.500 uIU/mL   Free T4 0.94 0.82 - 1.77 ng/dL  Lipid Panel With LDL/HDL Ratio     Status: Abnormal   Collection Time: 01/16/23 10:29 AM  Result Value Ref Range   Cholesterol, Total 190 100 - 199 mg/dL   Triglycerides 62 0 - 149 mg/dL   HDL 58 >14 mg/dL   VLDL Cholesterol Cal 12 5 - 40 mg/dL   LDL Chol Calc (NIH) 782 (H) 0 - 99 mg/dL   LDL/HDL Ratio 2.1 0.0 - 3.2 ratio    Comment:                                     LDL/HDL Ratio                                             Men  Women                               1/2 Avg.Risk  1.0    1.5                                   Avg.Risk  3.6    3.2                                2X Avg.Risk  6.2    5.0                                3X Avg.Risk  8.0    6.1   VITAMIN D 25 Hydroxy (Vit-D Deficiency, Fractures)      Status: Abnormal   Collection Time: 01/16/23 10:29 AM  Result Value Ref Range   Vit D, 25-Hydroxy 29.7 (L) 30.0 - 100.0 ng/mL    Comment: Vitamin D deficiency has been defined by the Institute of Medicine and an Endocrine Society practice guideline as a level of serum 25-OH vitamin D less than 20 ng/mL (1,2). The Endocrine Society went on to further define vitamin D  insufficiency as a level between 21 and 29 ng/mL (2). 1. IOM (Institute of Medicine). 2010. Dietary reference    intakes for calcium and D. [place]: The    [employer]. 2. Holick MF, Binkley [place], Bischoff-Ferrari HA, et al.    Evaluation, treatment, and prevention of vitamin D    deficiency: an Endocrine Society clinical practice    guideline. JCEM. 2011 Jul; 96(7):1911-30.   B12 and Folate Panel     Status: None   Collection Time: 01/16/23 10:29 AM  Result Value Ref Range   Vitamin B-12 396 232 - 1,245 pg/mL   Folate 13.8 >3.0 ng/mL    Comment: A serum folate concentration of less than 3.1 ng/mL is considered to represent clinical deficiency.   Fe+TIBC+Fer     Status: None   Collection Time: 01/16/23 10:29 AM  Result Value Ref Range   Total Iron Binding Capacity 323 250 - 450 ug/dL   UIBC 161 096 - 045 ug/dL   Iron 83 27 - 409 ug/dL   Iron Saturation 26 15 - 55 %   Ferritin 15 15 - 150 ng/mL       Assessment/Plan: 1. Encounter for general adult medical examination with abnormal findings CPE performed, labs reviewed, pt will schedule mammogram via OBGYN  2. Essential hypertension Stable, continue current medication  3. Mixed hyperlipidemia Will increase to crestor every other day instead of 2x weekly and will consider fish oil supplement  4. GAD (generalized anxiety disorder) Stable on prozac  5. B12 deficiency - cyanocobalamin (VITAMIN B12) injection 1,000 mcg  6. Vitamin D deficiency Will start OTC supplement, ~1000IU daily  7. Visit for gynecologic examination Breast exam  performed  8. Dysuria - UA/M w/rflx Culture, Routine   General Counseling: Tiawanna verbalizes understanding of the findings of todays visit and agrees with plan of treatment. I have discussed any further diagnostic evaluation that may be needed or ordered today. We also reviewed her medications today. she has been encouraged to call the office with any questions or concerns that should arise related to todays visit.    Counseling:    Orders Placed This Encounter  Procedures   UA/M w/rflx Culture, Routine    Meds ordered this encounter  Medications   Zoster Vaccine Adjuvanted [facility]) injection    Sig: Inject 0.5 mLs into the muscle once for 1 dose.    Dispense:  0.5 mL    Refill:  0   Tdap (BOOSTRIX) 5-2.5-18.5 LF-MCG/0.5 injection    Sig: Inject 0.5 mLs into the muscle once for 1 dose.    Dispense:  0.5 mL    Refill:  0   cyanocobalamin (VITAMIN B12) injection 1,000 mcg    This patient was seen by Lynn Ito, PA-C in collaboration with Dr. Beverely Risen as a part of collaborative care agreement.  Total time spent:35 Minutes  Time spent includes review of chart, medications, test results, and follow up plan with the patient.     Lyndon Code, MD  Internal Medicine

## 2023-01-24 LAB — MICROSCOPIC EXAMINATION
Casts: NONE SEEN /LPF
Epithelial Cells (non renal): >10 /HPF — AB (ref 0–10)
RBC, Urine: NONE SEEN /hpf (ref 0–2)

## 2023-01-24 LAB — UA/M W/RFLX CULTURE, ROUTINE
Bilirubin, UA: NEGATIVE
Glucose, UA: NEGATIVE
Ketones, UA: NEGATIVE
Leukocytes,UA: NEGATIVE
Nitrite, UA: NEGATIVE
Protein,UA: NEGATIVE
RBC, UA: NEGATIVE
Specific Gravity, UA: 1.018 (ref 1.005–1.030)
Urobilinogen, Ur: 0.2 mg/dL (ref 0.2–1.0)
pH, UA: 6.5 (ref 5.0–7.5)

## 2023-01-24 LAB — URINE CULTURE, REFLEX

## 2023-01-25 ENCOUNTER — Encounter: Payer: BC Managed Care – PPO | Admitting: Physician Assistant

## 2023-01-28 ENCOUNTER — Other Ambulatory Visit: Payer: Self-pay | Admitting: Physician Assistant

## 2023-01-28 DIAGNOSIS — N3000 Acute cystitis without hematuria: Secondary | ICD-10-CM

## 2023-01-28 MED ORDER — AMPICILLIN 500 MG PO CAPS
500.0000 mg | ORAL_CAPSULE | Freq: Three times a day (TID) | ORAL | 0 refills | Status: DC
Start: 2023-01-28 — End: 2023-07-09

## 2023-01-29 ENCOUNTER — Other Ambulatory Visit: Payer: Self-pay | Admitting: Physician Assistant

## 2023-01-29 ENCOUNTER — Telehealth: Payer: Self-pay

## 2023-01-29 NOTE — Telephone Encounter (Signed)
-----   Message from Carlean Jews, PA-C sent at 01/28/2023 10:12 AM EDT ----- Please let her know she does have UTI and I have sent Ampicillin for her

## 2023-01-29 NOTE — Telephone Encounter (Signed)
Spoke with patient regarding UTI and ABX.

## 2023-01-30 ENCOUNTER — Ambulatory Visit (INDEPENDENT_AMBULATORY_CARE_PROVIDER_SITE_OTHER): Payer: BC Managed Care – PPO

## 2023-01-30 DIAGNOSIS — E538 Deficiency of other specified B group vitamins: Secondary | ICD-10-CM

## 2023-01-30 MED ORDER — CYANOCOBALAMIN 1000 MCG/ML IJ SOLN
1000.0000 ug | Freq: Once | INTRAMUSCULAR | Status: AC
Start: 2023-01-30 — End: 2023-01-30
  Administered 2023-01-30: 1000 ug via INTRAMUSCULAR

## 2023-02-06 ENCOUNTER — Ambulatory Visit (INDEPENDENT_AMBULATORY_CARE_PROVIDER_SITE_OTHER): Payer: BC Managed Care – PPO

## 2023-02-06 DIAGNOSIS — E538 Deficiency of other specified B group vitamins: Secondary | ICD-10-CM

## 2023-02-06 MED ORDER — CYANOCOBALAMIN 1000 MCG/ML IJ SOLN
1000.0000 ug | Freq: Once | INTRAMUSCULAR | Status: AC
Start: 2023-02-06 — End: 2023-02-06
  Administered 2023-02-06: 1000 ug via INTRAMUSCULAR

## 2023-02-12 ENCOUNTER — Other Ambulatory Visit: Payer: Self-pay | Admitting: Obstetrics & Gynecology

## 2023-02-12 DIAGNOSIS — Z1231 Encounter for screening mammogram for malignant neoplasm of breast: Secondary | ICD-10-CM

## 2023-02-13 ENCOUNTER — Ambulatory Visit (INDEPENDENT_AMBULATORY_CARE_PROVIDER_SITE_OTHER): Payer: BC Managed Care – PPO

## 2023-02-13 DIAGNOSIS — E538 Deficiency of other specified B group vitamins: Secondary | ICD-10-CM

## 2023-02-13 MED ORDER — CYANOCOBALAMIN 1000 MCG/ML IJ SOLN
1000.0000 ug | Freq: Once | INTRAMUSCULAR | Status: AC
Start: 2023-02-13 — End: 2023-02-13
  Administered 2023-02-13: 1000 ug via INTRAMUSCULAR

## 2023-02-14 ENCOUNTER — Ambulatory Visit
Admission: RE | Admit: 2023-02-14 | Discharge: 2023-02-14 | Disposition: A | Discharge status: Home / Self Care | Payer: BC Managed Care – PPO | Source: Ambulatory Visit | Attending: Obstetrics & Gynecology | Admitting: Obstetrics & Gynecology

## 2023-02-14 DIAGNOSIS — Z1231 Encounter for screening mammogram for malignant neoplasm of breast: Secondary | ICD-10-CM | POA: Diagnosis present

## 2023-03-06 ENCOUNTER — Ambulatory Visit (INDEPENDENT_AMBULATORY_CARE_PROVIDER_SITE_OTHER): Payer: BC Managed Care – PPO

## 2023-03-06 DIAGNOSIS — E538 Deficiency of other specified B group vitamins: Secondary | ICD-10-CM

## 2023-03-06 MED ORDER — CYANOCOBALAMIN 1000 MCG/ML IJ SOLN
1000.0000 ug | Freq: Once | INTRAMUSCULAR | Status: AC
Start: 2023-03-06 — End: 2023-03-06
  Administered 2023-03-06: 1000 ug via INTRAMUSCULAR

## 2023-04-04 ENCOUNTER — Ambulatory Visit: Payer: BC Managed Care – PPO

## 2023-05-02 ENCOUNTER — Ambulatory Visit: Payer: BC Managed Care – PPO

## 2023-05-30 ENCOUNTER — Encounter: Payer: Self-pay | Admitting: Physician Assistant

## 2023-05-30 ENCOUNTER — Ambulatory Visit (INDEPENDENT_AMBULATORY_CARE_PROVIDER_SITE_OTHER): Payer: BC Managed Care – PPO | Admitting: Physician Assistant

## 2023-05-30 VITALS — BP 136/84 | HR 99 | Temp 98.2°F | Resp 16 | Ht 62.0 in | Wt 174.4 lb

## 2023-05-30 DIAGNOSIS — Z8249 Family history of ischemic heart disease and other diseases of the circulatory system: Secondary | ICD-10-CM

## 2023-05-30 DIAGNOSIS — R319 Hematuria, unspecified: Secondary | ICD-10-CM

## 2023-05-30 DIAGNOSIS — N390 Urinary tract infection, site not specified: Secondary | ICD-10-CM | POA: Diagnosis not present

## 2023-05-30 DIAGNOSIS — R300 Dysuria: Secondary | ICD-10-CM

## 2023-05-30 LAB — POCT URINALYSIS DIPSTICK
Bilirubin, UA: NEGATIVE
Blood, UA: POSITIVE
Glucose, UA: NEGATIVE
Ketones, UA: NEGATIVE
Nitrite, UA: NEGATIVE
Protein, UA: NEGATIVE
Spec Grav, UA: 1.01 (ref 1.010–1.025)
Urobilinogen, UA: 0.2 U/dL
pH, UA: 5 (ref 5.0–8.0)

## 2023-05-30 MED ORDER — NITROFURANTOIN MONOHYD MACRO 100 MG PO CAPS: ORAL_CAPSULE | ORAL | 0 refills | Status: DC

## 2023-05-30 NOTE — Progress Notes (Signed)
[facility] [address] [place], [place] [address]  Internal MEDICINE  Office Visit Note  Patient Name: Stephanie Ferrell  [id number]  [id number]  Date of Service: 06/05/2023  Chief Complaint  Patient presents with   Acute Visit   Urinary Tract Infection     HPI Pt is here for a sick visit. -Pressure in pelvis, frequency and urgency -Some low back pain -No change in urine color, a little odor -Will try to increase water -Mom had cardiomyopathy and brother suddenly passed out and was diagnosed with genetic heart condition and now has AICD. It was recommended she have further evaluation. Denies any symptoms  Current Medication:  Outpatient Encounter Medications as of 05/30/2023  Medication Sig   amLODipine (NORVASC) 5 MG tablet TAKE ONE TABLET BY MOUTH EVERY DAY   ampicillin (PRINCIPEN) 500 MG capsule Take 1 capsule (500 mg total) by mouth 3 (three) times daily.   FLUoxetine (PROZAC) 40 MG capsule TAKE 1 CAPSULE BY MOUTH EVERY DAY   fluticasone (FLONASE) 50 MCG/ACT nasal spray Place 2 sprays into both nostrils daily.   nitrofurantoin, macrocrystal-monohydrate, (MACROBID) 100 MG capsule Take 1 cap twice per day for 10 days.   norethindrone (MICRONOR) 0.35 MG tablet Take 1 tablet (0.35 mg total) by mouth daily.   rosuvastatin (CRESTOR) 5 MG tablet TAKE 1 TABLET BY MOUTH DAILY   tolterodine (DETROL LA) 4 MG 24 hr capsule Take 1 capsule (4 mg total) by mouth daily.   No facility-administered encounter medications on file as of 05/30/2023.      Medical History: Past Medical History:  Diagnosis Date   Hypertension      Vital Signs: BP 136/84 Comment: 120/90  Pulse 99   Temp 98.2 F (36.8 C)   Resp 16   Ht 5\' 2"  (1.575 m)   Wt 174 lb 6.4 oz (79.1 kg)   SpO2 98%   BMI 31.90 kg/m    Review of Systems  Constitutional:  Negative for fatigue and fever.  HENT:  Negative for congestion, mouth sores and postnasal drip.   Respiratory:  Negative for cough  and shortness of breath.   Cardiovascular:  Negative for chest pain and leg swelling.  Genitourinary:  Positive for frequency, pelvic pain and urgency.  Musculoskeletal:  Positive for back pain.  Psychiatric/Behavioral: Negative.      Physical Exam Vitals and nursing note reviewed.  Constitutional:      General: She is not in acute distress.    Appearance: Normal appearance. She is well-developed. She is not diaphoretic.  HENT:     Head: Normocephalic and atraumatic.     Mouth/Throat:     Pharynx: No oropharyngeal exudate.  Eyes:     Pupils: Pupils are equal, round, and reactive to light.  Neck:     Thyroid: No thyromegaly.     Vascular: No JVD.     Trachea: No tracheal deviation.  Cardiovascular:     Rate and Rhythm: Normal rate and regular rhythm.     Heart sounds: Normal heart sounds. No murmur heard.    No friction rub. No gallop.  Pulmonary:     Effort: Pulmonary effort is normal.  Chest:     Chest wall: No tenderness.  Breasts:    Right: Normal. No mass.     Left: Normal. No mass.  Abdominal:     General: Bowel sounds are normal.     Palpations: Abdomen is soft.  Musculoskeletal:        General: Normal  range of motion.     Cervical back: Normal range of motion and neck supple.  Lymphadenopathy:     Cervical: No cervical adenopathy.  Skin:    General: Skin is warm and dry.  Neurological:     Mental Status: She is alert and oriented to person, place, and time.     Cranial Nerves: No cranial nerve deficit.  Psychiatric:        Behavior: Behavior normal.       Assessment/Plan: 1. Family history of cardiomyopathy Will refer to cardiology for further evaluation due to Fhx of genetic heart condition.  - Ambulatory referral to Cardiology  2. Urinary tract infection with hematuria, site unspecified Will go ahead and treat with macrobid and adjust based on C/S - CULTURE, URINE COMPREHENSIVE - nitrofurantoin, macrocrystal-monohydrate, (MACROBID) 100 MG capsule;  Take 1 cap twice per day for 10 days.  Dispense: 20 capsule; Refill: 0  3. Dysuria - POCT Urinalysis Dipstick   General Counseling: Tamarah verbalizes understanding of the findings of todays visit and agrees with plan of treatment. I have discussed any further diagnostic evaluation that may be needed or ordered today. We also reviewed her medications today. she has been encouraged to call the office with any questions or concerns that should arise related to todays visit.    Counseling:    Orders Placed This Encounter  Procedures   CULTURE, URINE COMPREHENSIVE   Ambulatory referral to Cardiology   POCT Urinalysis Dipstick    Meds ordered this encounter  Medications   nitrofurantoin, macrocrystal-monohydrate, (MACROBID) 100 MG capsule    Sig: Take 1 cap twice per day for 10 days.    Dispense:  20 capsule    Refill:  0    Time spent:30 Minutes

## 2023-06-03 LAB — CULTURE, URINE COMPREHENSIVE

## 2023-06-06 ENCOUNTER — Ambulatory Visit: Payer: BC Managed Care – PPO

## 2023-06-06 ENCOUNTER — Telehealth: Payer: Self-pay

## 2023-06-06 NOTE — Telephone Encounter (Signed)
-----   Message from Carlean Jews sent at 06/04/2023 11:03 AM EST ----- Please let her know that urine culture came back with normal flora which typically doesn't require ABX

## 2023-06-06 NOTE — Telephone Encounter (Signed)
Spoke with patients regarding urine culture results.

## 2023-07-04 ENCOUNTER — Ambulatory Visit: Payer: BC Managed Care – PPO

## 2023-07-09 ENCOUNTER — Other Ambulatory Visit: Payer: Self-pay

## 2023-07-09 ENCOUNTER — Telehealth: Payer: Self-pay

## 2023-07-09 MED ORDER — AMOXICILLIN-POT CLAVULANATE 875-125 MG PO TABS: 1.0000 | ORAL_TABLET | Freq: Two times a day (BID) | ORAL | 0 refills | Status: DC

## 2023-07-09 NOTE — Telephone Encounter (Signed)
pt called that she is sinus infection,watery eye and coughing Covid test is negative going on for 3 days she tried Mucinex and Claritin as per Dr Welton Flakes sent Augmentin for 7 days take with food and also take probiotic and take Mucinex OTC

## 2023-07-26 ENCOUNTER — Ambulatory Visit (INDEPENDENT_AMBULATORY_CARE_PROVIDER_SITE_OTHER): Payer: Self-pay | Admitting: Physician Assistant

## 2023-07-26 ENCOUNTER — Encounter: Payer: Self-pay | Admitting: Physician Assistant

## 2023-07-26 VITALS — BP 122/70 | HR 77 | Temp 97.8°F | Resp 16 | Ht 62.0 in | Wt 173.0 lb

## 2023-07-26 DIAGNOSIS — I10 Essential (primary) hypertension: Secondary | ICD-10-CM | POA: Diagnosis not present

## 2023-07-26 DIAGNOSIS — E782 Mixed hyperlipidemia: Secondary | ICD-10-CM | POA: Diagnosis not present

## 2023-07-26 DIAGNOSIS — E538 Deficiency of other specified B group vitamins: Secondary | ICD-10-CM | POA: Diagnosis not present

## 2023-07-26 DIAGNOSIS — R946 Abnormal results of thyroid function studies: Secondary | ICD-10-CM

## 2023-07-26 DIAGNOSIS — R5383 Other fatigue: Secondary | ICD-10-CM

## 2023-07-26 DIAGNOSIS — E559 Vitamin D deficiency, unspecified: Secondary | ICD-10-CM

## 2023-07-26 DIAGNOSIS — J0100 Acute maxillary sinusitis, unspecified: Secondary | ICD-10-CM

## 2023-07-26 MED ORDER — AMOXICILLIN-POT CLAVULANATE 875-125 MG PO TABS
1.0000 | ORAL_TABLET | Freq: Two times a day (BID) | ORAL | 0 refills | Status: DC
Start: 2023-07-26 — End: 2023-08-15

## 2023-07-26 NOTE — Progress Notes (Signed)
 [facility] [address] [place], [place] [address]  Internal MEDICINE  Office Visit Note  Patient Name: Stephanie Ferrell  [id number]  [id number]  Date of Service: 07/26/2023  Chief Complaint  Patient presents with   Follow-up   Hypertension    HPI Pt is here for routine follow up -Cardiology appt later this month due to discovery of Fhx of genetic heart condition -sinus congestion still present. Had been given augmentin  mid dec for 1 week and got better a few days, but hen came back again. A little coughing, no wheezing or SOB. Had a fever 1 night (last Sat), then broke. -Using mucinex, will start flonase   -due for labs before CPE -recently had annual GYN visit, no changes  Current Medication: Outpatient Encounter Medications as of 07/26/2023  Medication Sig   amLODipine  (NORVASC ) 5 MG tablet TAKE ONE TABLET BY MOUTH EVERY DAY   amoxicillin -clavulanate (AUGMENTIN ) 875-125 MG tablet Take 1 tablet by mouth 2 (two) times daily. Take with food.   FLUoxetine  (PROZAC ) 40 MG capsule TAKE 1 CAPSULE BY MOUTH EVERY DAY   fluticasone  (FLONASE ) 50 MCG/ACT nasal spray Place 2 sprays into both nostrils daily.   nitrofurantoin , macrocrystal-monohydrate, (MACROBID ) 100 MG capsule Take 1 cap twice per day for 10 days.   norethindrone  (MICRONOR ) 0.35 MG tablet Take 1 tablet (0.35 mg total) by mouth daily.   rosuvastatin  (CRESTOR ) 5 MG tablet TAKE 1 TABLET BY MOUTH DAILY   tolterodine  (DETROL  LA) 4 MG 24 hr capsule Take 1 capsule (4 mg total) by mouth daily.   [DISCONTINUED] amoxicillin -clavulanate (AUGMENTIN ) 875-125 MG tablet Take 1 tablet by mouth 2 (two) times daily.   No facility-administered encounter medications on file as of 07/26/2023.    Surgical History: Past Surgical History:  Procedure Laterality Date   CESAREAN SECTION     COLONOSCOPY WITH PROPOFOL  N/A 03/01/2021   Procedure: COLONOSCOPY WITH PROPOFOL ;  Surgeon: Janalyn Keene NOVAK, MD;  Location: [facility] ENDOSCOPY;   Service: Endoscopy;  Laterality: N/A;   XI ROBOTIC LAPAROSCOPIC ASSISTED APPENDECTOMY N/A 01/27/2022   Procedure: XI ROBOTIC LAPAROSCOPIC ASSISTED APPENDECTOMY;  Surgeon: Rodolph Romano, MD;  Location: [facility] ORS;  Service: General;  Laterality: N/A;    Medical History: Past Medical History:  Diagnosis Date   Hypertension     Family History: Family History  Problem Relation Age of Onset   Anxiety disorder Mother    Depression Mother    Hypertension Father    Diabetes Maternal Uncle    Colon cancer Maternal Grandfather    Cancer Paternal Grandfather    Breast cancer Neg Hx     Social History   Socioeconomic History   Marital status: Married    Spouse name: Not on file   Number of children: Not on file   Years of education: Not on file   Highest education level: Not on file  Occupational History   Not on file  Tobacco Use   Smoking status: Never   Smokeless tobacco: Never  Vaping Use   Vaping status: Never Used  Substance and Sexual Activity   Alcohol use: Yes    Comment: ocassionally    Drug use: No   Sexual activity: Yes    Birth control/protection: Pill  Other Topics Concern   Not on file  Social History Narrative   Not on file   Social Drivers of Health   Financial Resource Strain: Not on file  Food Insecurity: Not on file  Transportation Needs: Not on file  Physical Activity:  Not on file  Stress: Not on file  Social Connections: Not on file  Intimate Partner Violence: Not on file      Review of Systems  Constitutional:  Negative for chills and fatigue.  HENT:  Positive for congestion, postnasal drip and sinus pressure.   Respiratory:  Positive for cough. Negative for chest tightness, shortness of breath and wheezing.   Cardiovascular: Negative.  Negative for chest pain and palpitations.  Gastrointestinal:  Negative for diarrhea, nausea and vomiting.  Genitourinary:  Negative for dysuria.  Musculoskeletal:  Negative for myalgias.  Skin:   Negative for rash.  Neurological:  Negative for headaches.  Psychiatric/Behavioral:  Negative for self-injury and suicidal ideas. The patient is not nervous/anxious.     Vital Signs: BP 122/70   Pulse 77   Temp 97.8 F (36.6 C)   Resp 16   Ht 5' 2 (1.575 m)   Wt 173 lb (78.5 kg)   SpO2 99%   BMI 31.64 kg/m    Physical Exam Vitals and nursing note reviewed.  Constitutional:      General: She is not in acute distress.    Appearance: Normal appearance. She is well-developed. She is not diaphoretic.  HENT:     Head: Normocephalic and atraumatic.     Right Ear: Tympanic membrane normal.     Left Ear: Tympanic membrane normal.     Mouth/Throat:     Pharynx: No oropharyngeal exudate.  Eyes:     Pupils: Pupils are equal, round, and reactive to light.  Neck:     Thyroid: No thyromegaly.     Vascular: No JVD.     Trachea: No tracheal deviation.  Cardiovascular:     Rate and Rhythm: Normal rate and regular rhythm.     Heart sounds: Normal heart sounds. No murmur heard.    No friction rub. No gallop.  Pulmonary:     Effort: Pulmonary effort is normal.  Chest:     Chest wall: No tenderness.  Breasts:    Right: Normal. No mass.     Left: Normal. No mass.  Abdominal:     General: Bowel sounds are normal.     Palpations: Abdomen is soft.  Musculoskeletal:        General: Normal range of motion.     Cervical back: Normal range of motion and neck supple.  Lymphadenopathy:     Cervical: No cervical adenopathy.  Skin:    General: Skin is warm and dry.  Neurological:     Mental Status: She is alert and oriented to person, place, and time.     Cranial Nerves: No cranial nerve deficit.  Psychiatric:        Behavior: Behavior normal.        Assessment/Plan: 1. Acute non-recurrent maxillary sinusitis (Primary) Will start augmentin  for longer course, continue mucinex and will start on flonase  - amoxicillin -clavulanate (AUGMENTIN ) 875-125 MG tablet; Take 1 tablet by mouth  2 (two) times daily. Take with food.  Dispense: 20 tablet; Refill: 0  2. Essential hypertension Stable, continue current medication  3. B12 deficiency - B12 and Folate Panel  4. Mixed hyperlipidemia Continue crestor  and will update labs - Lipid Panel With LDL/HDL Ratio  5. Vitamin D  deficiency - VITAMIN D  25 Hydroxy (Vit-D Deficiency, Fractures)  6. Abnormal thyroid exam - TSH + free T4  7. Other fatigue - CBC w/Diff/Platelet - Comprehensive metabolic panel - TSH + free T4 - Lipid Panel With LDL/HDL Ratio - VITAMIN D  25 Hydroxy (  Vit-D Deficiency, Fractures) - B12 and Folate Panel - Iron, TIBC and Ferritin Panel   General Counseling: Angelyne verbalizes understanding of the findings of todays visit and agrees with plan of treatment. I have discussed any further diagnostic evaluation that may be needed or ordered today. We also reviewed her medications today. she has been encouraged to call the office with any questions or concerns that should arise related to todays visit.    Orders Placed This Encounter  Procedures   CBC w/Diff/Platelet   Comprehensive metabolic panel   TSH + free T4   Lipid Panel With LDL/HDL Ratio   VITAMIN D  25 Hydroxy (Vit-D Deficiency, Fractures)   B12 and Folate Panel   Iron, TIBC and Ferritin Panel    Meds ordered this encounter  Medications   amoxicillin -clavulanate (AUGMENTIN ) 875-125 MG tablet    Sig: Take 1 tablet by mouth 2 (two) times daily. Take with food.    Dispense:  20 tablet    Refill:  0    This patient was seen by Tinnie Pro, PA-C in collaboration with Dr. Sigrid Bathe as a part of collaborative care agreement.   Total time spent:30 Minutes Time spent includes review of chart, medications, test results, and follow up plan with the patient.      Dr Fozia M Khan Internal medicine

## 2023-08-15 ENCOUNTER — Ambulatory Visit: Payer: 59 | Attending: Cardiology | Admitting: Cardiology

## 2023-08-15 ENCOUNTER — Encounter: Payer: Self-pay | Admitting: Cardiology

## 2023-08-15 VITALS — BP 126/76 | HR 74 | Ht 62.0 in | Wt 172.0 lb

## 2023-08-15 DIAGNOSIS — E7800 Pure hypercholesterolemia, unspecified: Secondary | ICD-10-CM

## 2023-08-15 DIAGNOSIS — R9431 Abnormal electrocardiogram [ECG] [EKG]: Secondary | ICD-10-CM | POA: Diagnosis not present

## 2023-08-15 DIAGNOSIS — Z8249 Family history of ischemic heart disease and other diseases of the circulatory system: Secondary | ICD-10-CM | POA: Diagnosis not present

## 2023-08-15 DIAGNOSIS — I10 Essential (primary) hypertension: Secondary | ICD-10-CM

## 2023-08-15 NOTE — Progress Notes (Signed)
Cardiology Office Note:    Date:  08/15/2023   ID:  Stephanie Ferrell, DOB [birth year], MRN [id number]  PCP:  Alan Ripper   [facility] Providers Cardiologist:  None     Referring MD: Carlean Jews, PA*   Chief Complaint  Patient presents with   New Patient (Initial Visit)    Referred for cardiac evaluation of Family history of cardiomyopathy.  No personal cardiac history.     Stephanie Ferrell is a 53 y.o. female who is being seen today for the evaluation of cardiac risk at the request of McDonough, Salomon Fick, PA*.   History of Present Illness:    Stephanie Ferrell is a 53 y.o. female with a hx of hypertension, hyperlipidemia presenting due to family history of hypertrophic cardiomyopathy.  Patient's older brother currently age 53 was diagnosed with HCM about a year ago.  Close family was advised to get screening.  Patient denies chest pain, shortness of breath, dizziness, palpitations, presyncope or syncope.  She is compliant with BP medications and cholesterol medications as prescribed.  Denies ever smoking.  States her mother has a cardiomyopathy, unsure of specific type.  Past Medical History:  Diagnosis Date   Hyperlipidemia    Hypertension     Past Surgical History:  Procedure Laterality Date   CESAREAN SECTION     COLONOSCOPY WITH PROPOFOL N/A 03/01/2021   Procedure: COLONOSCOPY WITH PROPOFOL;  Surgeon: Pasty Spillers, MD;  Location: [facility] ENDOSCOPY;  Service: Endoscopy;  Laterality: N/A;   XI ROBOTIC LAPAROSCOPIC ASSISTED APPENDECTOMY N/A 01/27/2022   Procedure: XI ROBOTIC LAPAROSCOPIC ASSISTED APPENDECTOMY;  Surgeon: Carolan Shiver, MD;  Location: [facility] ORS;  Service: General;  Laterality: N/A;    Current Medications: Current Meds  Medication Sig   amLODipine (NORVASC) 5 MG tablet TAKE ONE TABLET BY MOUTH EVERY DAY   FLUoxetine (PROZAC) 40 MG capsule TAKE 1 CAPSULE BY MOUTH EVERY DAY   fluticasone (FLONASE) 50 MCG/ACT nasal  spray Place 2 sprays into both nostrils daily.   norethindrone (MICRONOR) 0.35 MG tablet Take 1 tablet (0.35 mg total) by mouth daily.   rosuvastatin (CRESTOR) 5 MG tablet TAKE 1 TABLET BY MOUTH DAILY   tolterodine (DETROL LA) 4 MG 24 hr capsule Take 1 capsule (4 mg total) by mouth daily.     Allergies:   Egg-derived products and Shellfish allergy   Social History   Socioeconomic History   Marital status: Married    Spouse name: Not on file   Number of children: Not on file   Years of education: Not on file   Highest education level: Not on file  Occupational History   Not on file  Tobacco Use   Smoking status: Never   Smokeless tobacco: Never  Vaping Use   Vaping status: Never Used  Substance and Sexual Activity   Alcohol use: Yes    Comment: ocassionally    Drug use: No   Sexual activity: Yes    Birth control/protection: Pill  Other Topics Concern   Not on file  Social History Narrative   Not on file   Social Drivers of Health   Financial Resource Strain: Not on file  Food Insecurity: Not on file  Transportation Needs: Not on file  Physical Activity: Not on file  Stress: Not on file  Social Connections: Not on file     Family History: The patient's family history includes Anxiety disorder in her mother; Cancer in her paternal grandfather; Cardiomyopathy in her  mother; Colon cancer in her maternal grandfather; Depression in her mother; Diabetes in her maternal uncle; Hypertension in her father; Hypertrophic cardiomyopathy in her brother. There is no history of Breast cancer.  ROS:   Please see the history of present illness.     All other systems reviewed and are negative.  EKGs/Labs/Other Studies Reviewed:    The following studies were reviewed today:  EKG Interpretation Date/Time:  Thursday August 15 2023 08:40:12 EST Ventricular Rate:  74 PR Interval:  160 QRS Duration:  76 QT Interval:  402 QTC Calculation: 446 R Axis:   10  Text  Interpretation: Normal sinus rhythm Septal infarct , age undetermined Confirmed by Debbe Odea ([id number]) on 08/15/2023 8:55:19 AM    Recent Labs: 01/16/2023: ALT 10; BUN 10; Creatinine, Ser 0.73; Hemoglobin 12.5; Platelets 295; Potassium 4.4; Sodium 139; TSH 1.340  Recent Lipid Panel    Component Value Date/Time   CHOL 190 01/16/2023 1029   TRIG 62 01/16/2023 1029   HDL 58 01/16/2023 1029   LDLCALC 120 (H) 01/16/2023 1029     Risk Assessment/Calculations:             Physical Exam:    VS:  BP 126/76 (BP Location: Left Arm, Patient Position: Sitting, Cuff Size: Large)   Pulse 74   Ht 5\' 2"  (1.575 m)   Wt 172 lb (78 kg)   SpO2 98%   BMI 31.46 kg/m     Wt Readings from Last 3 Encounters:  08/15/23 172 lb (78 kg)  07/26/23 173 lb (78.5 kg)  05/30/23 174 lb 6.4 oz (79.1 kg)     GEN:  Well nourished, well developed in no acute distress HEENT: Normal NECK: No JVD; No carotid bruits CARDIAC: RRR, no murmurs, rubs, gallops RESPIRATORY:  Clear to auscultation without rales, wheezing or rhonchi  ABDOMEN: Soft, non-tender, non-distended MUSCULOSKELETAL:  No edema; No deformity  SKIN: Warm and dry NEUROLOGIC:  Alert and oriented x 3 PSYCHIATRIC:  Normal affect   ASSESSMENT:    1. Abnormal EKG   2. Family history of cardiomyopathy   3. Primary hypertension   4. Pure hypercholesterolemia    PLAN:    In order of problems listed above:  Family history of HCM, septal infarct on EKG.  Obtain echo to evaluate any structural abnormalities.  Patient otherwise asymptomatic, denies any cardiac symptoms such as chest pain, dizziness, presyncope or syncope. Hypertension, BP controlled.  Continue Norvasc 5 mg daily. Hyperlipidemia, continue Crestor 5 mg daily.  Follow-up after echo.      Medication Adjustments/Labs and Tests Ordered: Current medicines are reviewed at length with the patient today.  Concerns regarding medicines are outlined above.  Orders Placed This  Encounter  Procedures   EKG 12-Lead   ECHOCARDIOGRAM COMPLETE   No orders of the defined types were placed in this encounter.   Patient Instructions  Medication Instructions:  The current medical regimen is effective;  continue present plan and medications.  *If you need a refill on your cardiac medications before your next appointment, please call your pharmacy*   Testing/Procedures: Your physician has requested that you have an echocardiogram. Echocardiography is a painless test that uses sound waves to create images of your heart. It provides your doctor with information about the size and shape of your heart and how well your heart's chambers and valves are working.   You may receive an ultrasound enhancing agent through an IV if needed to better visualize your heart during the echo. This procedure  takes approximately one hour.  There are no restrictions for this procedure.  This will take place at [address] [facility] [address], [place] [address]  Please note: We ask at that you not bring children with you during ultrasound (echo/ vascular) testing. Due to room size and safety concerns, children are not allowed in the ultrasound rooms during exams. Our front office staff cannot provide observation of children in our lobby area while testing is being conducted. An adult accompanying a patient to their appointment will only be allowed in the ultrasound room at the discretion of the ultrasound technician under special circumstances. We apologize for any inconvenience.     Follow-Up: At [facility], you and your health needs are our priority.  As part of our continuing mission to provide you with exceptional heart care, we have created designated Provider Care Teams.  These Care Teams include your primary Cardiologist (physician) and Advanced Practice Providers (APPs -  Physician Assistants and Nurse Practitioners) who all work together to provide you with  the care you need, when you need it.  We recommend signing up for the patient portal called "MyChart".  Sign up information is provided on this After Visit Summary.  MyChart is used to connect with patients for Virtual Visits (Telemedicine).  Patients are able to view lab/test results, encounter notes, upcoming appointments, etc.  Non-urgent messages can be sent to your provider as well.   To learn more about what you can do with MyChart, go to [email or web address].    Your next appointment:   2 month(s) post ECHO  Provider:   You may see Debbe Odea, MD or one of the following Advanced Practice Providers on your designated Care Team:   Nicolasa Ducking, NP Eula Listen, PA-C Cadence Fransico Michael, PA-C Charlsie Quest, NP Carlos Levering, NP           Signed, Debbe Odea, MD  08/15/2023 9:33 AM    [facility]

## 2023-08-15 NOTE — Patient Instructions (Signed)
Medication Instructions:  The current medical regimen is effective;  continue present plan and medications.  *If you need a refill on your cardiac medications before your next appointment, please call your pharmacy*   Testing/Procedures: Your physician has requested that you have an echocardiogram. Echocardiography is a painless test that uses sound waves to create images of your heart. It provides your doctor with information about the size and shape of your heart and how well your heart's chambers and valves are working.   You may receive an ultrasound enhancing agent through an IV if needed to better visualize your heart during the echo. This procedure takes approximately one hour.  There are no restrictions for this procedure.  This will take place at [address] [facility] [place] [facility] [address]) [address], [place] [address]  Please note: We ask at that you not bring children with you during ultrasound (echo/ vascular) testing. Due to room size and safety concerns, children are not allowed in the ultrasound rooms during exams. Our front office staff cannot provide observation of children in our lobby area while testing is being conducted. An adult accompanying a patient to their appointment will only be allowed in the ultrasound room at the discretion of the ultrasound technician under special circumstances. We apologize for any inconvenience.     Follow-Up: At [facility], you and your health needs are our priority.  As part of our continuing mission to provide you with exceptional heart care, we have created designated Provider Care Teams.  These Care Teams include your primary Cardiologist (physician) and Advanced Practice Providers (APPs -  Physician Assistants and Nurse Practitioners) who all work together to provide you with the care you need, when you need it.  We recommend signing up for the patient portal called "MyChart".  Sign up information is provided on this After Visit  Summary.  MyChart is used to connect with patients for Virtual Visits (Telemedicine).  Patients are able to view lab/test results, encounter notes, upcoming appointments, etc.  Non-urgent messages can be sent to your provider as well.   To learn more about what you can do with MyChart, go to [email or web address].    Your next appointment:   2 month(s) post ECHO  Provider:   You may see Debbe Odea, MD or one of the following Advanced Practice Providers on your designated Care Team:   Nicolasa Ducking, NP Eula Listen, PA-C Cadence Fransico Michael, PA-C Charlsie Quest, NP Carlos Levering, NP

## 2023-08-29 ENCOUNTER — Ambulatory Visit: Payer: 59 | Attending: Cardiology

## 2023-08-29 DIAGNOSIS — Z8249 Family history of ischemic heart disease and other diseases of the circulatory system: Secondary | ICD-10-CM | POA: Diagnosis not present

## 2023-08-29 DIAGNOSIS — R9431 Abnormal electrocardiogram [ECG] [EKG]: Secondary | ICD-10-CM

## 2023-09-01 LAB — ECHOCARDIOGRAM COMPLETE
AV Mean grad: 4 mm[Hg]
AV Peak grad: 7.1 mm[Hg]
Ao pk vel: 1.33 m/s
Area-P 1/2: 3.99 cm2
Calc EF: 62 %
S' Lateral: 3.9 cm
Single Plane A2C EF: 60.4 %
Single Plane A4C EF: 62.6 %

## 2023-09-09 ENCOUNTER — Other Ambulatory Visit: Payer: Self-pay | Admitting: Physician Assistant

## 2023-09-09 DIAGNOSIS — I10 Essential (primary) hypertension: Secondary | ICD-10-CM

## 2023-10-12 NOTE — Progress Notes (Deleted)
   Cardiology Clinic Note   Date: 10/12/2023 ID: ZOSIA LUCCHESE, DOB 03-Apr-1971, MRN [id number]  Primary Cardiologist:  None  Chief Complaint   Stephanie Ferrell is a 53 y.o. female who presents to the clinic today for ***  Patient Profile   Stephanie Ferrell is followed by Dr. Azucena Cecil for the history outlined below.      Past medical history significant for: Abnormal EKG. Echo 08/29/2023: EF 55 to 60%.  No RWMA.  Grade I DD.  Normal RV size/function.  Trivial MR/AI. Hypertension. Hyperlipidemia. Lipid panel 01/16/2023: LDL 120, HDL 50, TG 62, total 190. Family history of HCM.  In summary, patient was first evaluated by Dr. Azucena Cecil on 08/15/2023 for cardiac restratification at the request of Lauren McDonough, PA-C.  Patient reported her brother age 63 was diagnosed with HCM the year prior.  Her mother also has a form of cardiomyopathy but she was unsure which side.  She denied any concerning cardiac symptoms.  Her EKG reflected normal sinus rhythm with septal infarct age undetermined.  Echo was normal as detailed above.     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  Echo February 2025 demonstrated normal LV/RV function, no RWMA, Grade I DD, trivial MR/AI.  Patient*** -Repeat echo as clinically indicated.  Hypertension BP today*** -Continue amlodipine.  Hyperlipidemia LDL 120 June 2024, not at goal. -Continue rosuvastatin. -Continue to follow with PCP.  ROS: All other systems reviewed and are otherwise negative except as noted in History of Present Illness.  EKGs/Labs Reviewed        01/16/2023: ALT 10; AST 13; BUN 10; Creatinine, Ser 0.73; Potassium 4.4; Sodium 139   01/16/2023: Hemoglobin 12.5; WBC 4.4   01/16/2023: TSH 1.340   No results found for requested labs within last 365 days.  ***  Risk Assessment/Calculations    {Does this patient have ATRIAL FIBRILLATION?:[phone]} No BP  recorded.  {Refresh Note OR Click here to enter BP  :1}***        Physical Exam    VS:  There were no vitals taken for this visit. , BMI There is no height or weight on file to calculate BMI.  GEN: Well nourished, well developed, in no acute distress. Neck: No JVD or carotid bruits. Cardiac: *** RRR. No murmurs. No rubs or gallops.   Respiratory:  Respirations regular and unlabored. Clear to auscultation without rales, wheezing or rhonchi. GI: Soft, nontender, nondistended. Extremities: Radials/DP/PT 2+ and equal bilaterally. No clubbing or cyanosis. No edema ***  Skin: Warm and dry, no rash. Neuro: Strength intact.  Assessment & Plan   ***  Disposition: ***     {Are you ordering a CV Procedure (e.g. stress test, cath, DCCV, TEE, etc)?   Press F2        :[id number]}   Signed, Etta Grandchild. Tecumseh Yeagley, DNP, NP-C

## 2023-10-14 ENCOUNTER — Ambulatory Visit: Payer: 59 | Attending: Student | Admitting: Student

## 2024-01-23 LAB — LIPID PANEL WITH LDL/HDL RATIO
Cholesterol, Total: 173 mg/dL (ref 100–199)
HDL: 53 mg/dL (ref >39)
LDL Chol Calc (NIH): 107 mg/dL — ABNORMAL HIGH (ref 0–99)
LDL/HDL Ratio: 2 ratio (ref 0.0–3.2)
Triglycerides: 66 mg/dL (ref 0–149)
VLDL Cholesterol Cal: 13 mg/dL (ref 5–40)

## 2024-01-23 LAB — CBC WITH DIFFERENTIAL/PLATELET
Basophils Absolute: 0.1 10*3/uL (ref 0.0–0.2)
Basos: 2 %
EOS (ABSOLUTE): 0.2 10*3/uL (ref 0.0–0.4)
Eos: 3 %
Hematocrit: 38.9 % (ref 34.0–46.6)
Hemoglobin: 12.5 g/dL (ref 11.1–15.9)
Immature Grans (Abs): 0 10*3/uL (ref 0.0–0.1)
Immature Granulocytes: 0 %
Lymphocytes Absolute: 1.6 10*3/uL (ref 0.7–3.1)
Lymphs: 37 %
MCH: 29.1 pg (ref 26.6–33.0)
MCHC: 32.1 g/dL (ref 31.5–35.7)
MCV: 91 fL (ref 79–97)
Monocytes Absolute: 0.5 10*3/uL (ref 0.1–0.9)
Monocytes: 10 %
Neutrophils Absolute: 2.1 10*3/uL (ref 1.4–7.0)
Neutrophils: 48 %
Platelets: 333 10*3/uL (ref 150–450)
RBC: 4.3 x10E6/uL (ref 3.77–5.28)
RDW: 12.7 % (ref 11.7–15.4)
WBC: 4.4 10*3/uL (ref 3.4–10.8)

## 2024-01-23 LAB — COMPREHENSIVE METABOLIC PANEL WITH GFR
ALT: 9 IU/L (ref 0–32)
AST: 16 IU/L (ref 0–40)
Albumin: 4.3 g/dL (ref 3.8–4.9)
Alkaline Phosphatase: 63 IU/L (ref 44–121)
BUN/Creatinine Ratio: 11 (ref 9–23)
BUN: 8 mg/dL (ref 6–24)
Bilirubin Total: 0.4 mg/dL (ref 0.0–1.2)
CO2: 19 mmol/L — ABNORMAL LOW (ref 20–29)
Calcium: 8.7 mg/dL (ref 8.7–10.2)
Chloride: 106 mmol/L (ref 96–106)
Creatinine, Ser: 0.74 mg/dL (ref 0.57–1.00)
Globulin, Total: 2.4 g/dL (ref 1.5–4.5)
Glucose: 95 mg/dL (ref 70–99)
Potassium: 4.3 mmol/L (ref 3.5–5.2)
Sodium: 139 mmol/L (ref 134–144)
Total Protein: 6.7 g/dL (ref 6.0–8.5)
eGFR: 97 mL/min/{1.73_m2} (ref >59)

## 2024-01-23 LAB — VITAMIN D 25 HYDROXY (VIT D DEFICIENCY, FRACTURES): Vit D, 25-Hydroxy: 27.3 ng/mL — ABNORMAL LOW (ref 30.0–100.0)

## 2024-01-23 LAB — IRON,TIBC AND FERRITIN PANEL
Ferritin: 19 ng/mL (ref 15–150)
Iron Saturation: 27 % (ref 15–55)
Iron: 85 ug/dL (ref 27–159)
Total Iron Binding Capacity: 310 ug/dL (ref 250–450)
UIBC: 225 ug/dL (ref 131–425)

## 2024-01-23 LAB — TSH+FREE T4
Free T4: 0.88 ng/dL (ref 0.82–1.77)
TSH: 1.01 u[IU]/mL (ref 0.450–4.500)

## 2024-01-23 LAB — B12 AND FOLATE PANEL
Folate: >20 ng/mL (ref >3.0)
Vitamin B-12: 777 pg/mL (ref 232–1245)

## 2024-01-27 ENCOUNTER — Encounter: Payer: Self-pay | Admitting: Physician Assistant

## 2024-01-27 ENCOUNTER — Ambulatory Visit (INDEPENDENT_AMBULATORY_CARE_PROVIDER_SITE_OTHER): Payer: 59 | Admitting: Physician Assistant

## 2024-01-27 VITALS — BP 122/69 | HR 84 | Temp 98.7°F | Resp 16 | Ht 62.0 in | Wt 178.0 lb

## 2024-01-27 DIAGNOSIS — Z23 Encounter for immunization: Secondary | ICD-10-CM

## 2024-01-27 DIAGNOSIS — E782 Mixed hyperlipidemia: Secondary | ICD-10-CM | POA: Diagnosis not present

## 2024-01-27 DIAGNOSIS — I10 Essential (primary) hypertension: Secondary | ICD-10-CM

## 2024-01-27 DIAGNOSIS — Z1231 Encounter for screening mammogram for malignant neoplasm of breast: Secondary | ICD-10-CM

## 2024-01-27 DIAGNOSIS — Z0001 Encounter for general adult medical examination with abnormal findings: Secondary | ICD-10-CM

## 2024-01-27 MED ORDER — ROSUVASTATIN CALCIUM 5 MG PO TABS
5.0000 mg | ORAL_TABLET | Freq: Every day | ORAL | 3 refills | Status: AC
Start: 2024-01-27 — End: ?

## 2024-01-27 MED ORDER — TETANUS-DIPHTH-ACELL PERTUSSIS 5-2.5-18.5 LF-MCG/0.5 IM SUSY
0.5000 mL | PREFILLED_SYRINGE | Freq: Once | INTRAMUSCULAR | 0 refills | Status: AC
Start: 2024-01-27 — End: 2024-01-27

## 2024-01-27 MED ORDER — AMLODIPINE BESYLATE 5 MG PO TABS
5.0000 mg | ORAL_TABLET | Freq: Every day | ORAL | 1 refills | Status: AC
Start: 2024-01-27 — End: ?

## 2024-01-27 NOTE — Progress Notes (Signed)
 [facility] [address] [place], [place] [address]  Internal MEDICINE  Office Visit Note  Patient Name: Stephanie Ferrell  [id number]  [id number]  Date of Service: 01/27/2024  Chief Complaint  Patient presents with   Annual Exam   Hypertension   Hyperlipidemia   Quality Metric Gaps    TDAP     HPI Pt is here for routine health maintenance examination -Doing well, no concerns today -due for tdap -limits crestor  to a few days per week, due to leg pain -labs reviewed: LDL improving, Vit D low and will supplement -Goes to Gyn for pap and breast exams, due for mammogram -saw cardiology due to Fhx of HCM, found she does not have cardiomyopathy, but did have an abnormal EKG, so she had an echo which she states looked good. She states she does not have further follow up planned -a little left side back pain, no urinary symptoms, and wonders if she sat and slept wrong. She is going to monitor and call if any concerns -BP stable  Current Medication: Outpatient Encounter Medications as of 01/27/2024  Medication Sig   FLUoxetine  (PROZAC ) 40 MG capsule TAKE 1 CAPSULE BY MOUTH EVERY DAY   fluticasone  (FLONASE ) 50 MCG/ACT nasal spray Place 2 sprays into both nostrils daily.   norethindrone  (MICRONOR ) 0.35 MG tablet Take 1 tablet (0.35 mg total) by mouth daily.   tolterodine  (DETROL  LA) 4 MG 24 hr capsule Take 1 capsule (4 mg total) by mouth daily.   [DISCONTINUED] amLODipine  (NORVASC ) 5 MG tablet TAKE ONE TABLET BY MOUTH EVERY DAY   [DISCONTINUED] rosuvastatin  (CRESTOR ) 5 MG tablet TAKE 1 TABLET BY MOUTH DAILY   [DISCONTINUED] Tdap (BOOSTRIX) 5-2.5-18.5 LF-MCG/0.5 injection Inject 0.5 mLs into the muscle once.   amLODipine  (NORVASC ) 5 MG tablet Take 1 tablet (5 mg total) by mouth daily.   rosuvastatin  (CRESTOR ) 5 MG tablet Take 1 tablet (5 mg total) by mouth daily.   Tdap (BOOSTRIX) 5-2.5-18.5 LF-MCG/0.5 injection Inject 0.5 mLs into the muscle once for 1 dose.   No  facility-administered encounter medications on file as of 01/27/2024.    Surgical History: Past Surgical History:  Procedure Laterality Date   CESAREAN SECTION     COLONOSCOPY WITH PROPOFOL  N/A 03/01/2021   Procedure: COLONOSCOPY WITH PROPOFOL ;  Surgeon: Janalyn Keene NOVAK, MD;  Location: [facility] ENDOSCOPY;  Service: Endoscopy;  Laterality: N/A;   XI ROBOTIC LAPAROSCOPIC ASSISTED APPENDECTOMY N/A 01/27/2022   Procedure: XI ROBOTIC LAPAROSCOPIC ASSISTED APPENDECTOMY;  Surgeon: Rodolph Romano, MD;  Location: [facility] ORS;  Service: General;  Laterality: N/A;    Medical History: Past Medical History:  Diagnosis Date   Hyperlipidemia    Hypertension     Family History: Family History  Problem Relation Age of Onset   Anxiety disorder Mother    Depression Mother    Cardiomyopathy Mother    Hypertension Father    Hypertrophic cardiomyopathy Brother    Diabetes Maternal Uncle    Colon cancer Maternal Grandfather    Cancer Paternal Grandfather    Breast cancer Neg Hx       Review of Systems  Constitutional:  Negative for chills and unexpected weight change.  HENT:  Negative for congestion, postnasal drip, rhinorrhea, sneezing and sore throat.   Eyes:  Negative for redness.  Respiratory:  Negative for cough, chest tightness and shortness of breath.   Cardiovascular:  Negative for chest pain and palpitations.  Gastrointestinal:  Negative for abdominal pain, constipation, diarrhea, nausea and vomiting.  Genitourinary:  Positive for flank pain. Negative for dysuria and frequency.  Musculoskeletal:  Positive for back pain. Negative for arthralgias, joint swelling and neck pain.  Skin:  Negative for rash.  Neurological: Negative.  Negative for tremors and numbness.  Hematological:  Negative for adenopathy. Does not bruise/bleed easily.  Psychiatric/Behavioral:  Negative for behavioral problems (Depression), sleep disturbance and suicidal ideas. The patient is not nervous/anxious.       Vital Signs: BP 122/69   Pulse 84   Temp 98.7 F (37.1 C)   Resp 16   Ht 5' 2 (1.575 m)   Wt 178 lb (80.7 kg)   SpO2 98%   BMI 32.56 kg/m    Physical Exam Vitals and nursing note reviewed.  Constitutional:      General: She is not in acute distress.    Appearance: Normal appearance. She is well-developed. She is not diaphoretic.  HENT:     Head: Normocephalic and atraumatic.     Right Ear: Tympanic membrane normal.     Left Ear: Tympanic membrane normal.     Mouth/Throat:     Pharynx: No oropharyngeal exudate.  Eyes:     Pupils: Pupils are equal, round, and reactive to light.  Neck:     Thyroid: No thyromegaly.     Vascular: No JVD.     Trachea: No tracheal deviation.  Cardiovascular:     Rate and Rhythm: Normal rate and regular rhythm.     Heart sounds: Normal heart sounds. No murmur heard.    No friction rub. No gallop.  Pulmonary:     Effort: Pulmonary effort is normal.  Chest:     Chest wall: No tenderness.  Breasts:    Right: Normal. No mass.     Left: Normal. No mass.  Abdominal:     General: Bowel sounds are normal.     Palpations: Abdomen is soft.     Tenderness: There is no abdominal tenderness.  Musculoskeletal:        General: No tenderness. Normal range of motion.     Cervical back: Normal range of motion and neck supple.  Lymphadenopathy:     Cervical: No cervical adenopathy.  Skin:    General: Skin is warm and dry.  Neurological:     Mental Status: She is alert and oriented to person, place, and time.     Cranial Nerves: No cranial nerve deficit.  Psychiatric:        Behavior: Behavior normal.      LABS: Recent Results (from the past 2160 hours)  CBC w/Diff/Platelet     Status: None   Collection Time: 01/22/24 11:46 AM  Result Value Ref Range   WBC 4.4 3.4 - 10.8 x10E3/uL   RBC 4.30 3.77 - 5.28 x10E6/uL   Hemoglobin 12.5 11.1 - 15.9 g/dL   Hematocrit 61.0 65.9 - 46.6 %   MCV 91 79 - 97 fL   MCH 29.1 26.6 - 33.0 pg   MCHC  32.1 31.5 - 35.7 g/dL   RDW 87.2 88.2 - 84.5 %   Platelets 333 150 - 450 x10E3/uL   Neutrophils 48 Not Estab. %   Lymphs 37 Not Estab. %   Monocytes 10 Not Estab. %   Eos 3 Not Estab. %   Basos 2 Not Estab. %   Neutrophils Absolute 2.1 1.4 - 7.0 x10E3/uL   Lymphocytes Absolute 1.6 0.7 - 3.1 x10E3/uL   Monocytes Absolute 0.5 0.1 - 0.9 x10E3/uL   EOS (ABSOLUTE) 0.2 0.0 - 0.4  x10E3/uL   Basophils Absolute 0.1 0.0 - 0.2 x10E3/uL   Immature Granulocytes 0 Not Estab. %   Immature Grans (Abs) 0.0 0.0 - 0.1 x10E3/uL  Comprehensive metabolic panel     Status: Abnormal   Collection Time: 01/22/24 11:46 AM  Result Value Ref Range   Glucose 95 70 - 99 mg/dL   BUN 8 6 - 24 mg/dL   Creatinine, Ser 9.25 0.57 - 1.00 mg/dL   eGFR 97 >40 fO/fpw/8.26   BUN/Creatinine Ratio 11 9 - 23   Sodium 139 134 - 144 mmol/L   Potassium 4.3 3.5 - 5.2 mmol/L   Chloride 106 96 - 106 mmol/L   CO2 19 (L) 20 - 29 mmol/L   Calcium  8.7 8.7 - 10.2 mg/dL   Total Protein 6.7 6.0 - 8.5 g/dL   Albumin 4.3 3.8 - 4.9 g/dL   Globulin, Total 2.4 1.5 - 4.5 g/dL   Bilirubin Total 0.4 0.0 - 1.2 mg/dL   Alkaline Phosphatase 63 44 - 121 IU/L   AST 16 0 - 40 IU/L   ALT 9 0 - 32 IU/L  TSH + free T4     Status: None   Collection Time: 01/22/24 11:46 AM  Result Value Ref Range   TSH 1.010 0.450 - 4.500 uIU/mL   Free T4 0.88 0.82 - 1.77 ng/dL  Lipid Panel With LDL/HDL Ratio     Status: Abnormal   Collection Time: 01/22/24 11:46 AM  Result Value Ref Range   Cholesterol, Total 173 100 - 199 mg/dL   Triglycerides 66 0 - 149 mg/dL   HDL 53 >60 mg/dL   VLDL Cholesterol Cal 13 5 - 40 mg/dL   LDL Chol Calc (NIH) 892 (H) 0 - 99 mg/dL   LDL/HDL Ratio 2.0 0.0 - 3.2 ratio    Comment:                                     LDL/HDL Ratio                                             Men  Women                               1/2 Avg.Risk  1.0    1.5                                   Avg.Risk  3.6    3.2                                2X  Avg.Risk  6.2    5.0                                3X Avg.Risk  8.0    6.1   VITAMIN D  25 Hydroxy (Vit-D Deficiency, Fractures)     Status: Abnormal   Collection Time: 01/22/24 11:46 AM  Result Value Ref Range   Vit D, 25-Hydroxy 27.3 (L) 30.0 - 100.0 ng/mL    Comment: Vitamin D  deficiency  has been defined by the Institute of Medicine and an Endocrine Society practice guideline as a level of serum 25-OH vitamin D  less than 20 ng/mL (1,2). The Endocrine Society went on to further define vitamin D  insufficiency as a level between 21 and 29 ng/mL (2). 1. IOM (Institute of Medicine). 2010. Dietary reference    intakes for calcium  and D. [place]: The    [employer]. 2. Holick MF, Binkley , Bischoff-Ferrari HA, et al.    Evaluation, treatment, and prevention of vitamin D     deficiency: an Endocrine Society clinical practice    guideline. JCEM. 2011 Jul; 96(7):1911-30.   B12 and Folate Panel     Status: None   Collection Time: 01/22/24 11:46 AM  Result Value Ref Range   Vitamin B-12 777 232 - 1,245 pg/mL   Folate >20.0 >3.0 ng/mL    Comment: A serum folate concentration of less than 3.1 ng/mL is considered to represent clinical deficiency.   Iron, TIBC and Ferritin Panel     Status: None   Collection Time: 01/22/24 11:46 AM  Result Value Ref Range   Total Iron Binding Capacity 310 250 - 450 ug/dL   UIBC 774 868 - 574 ug/dL   Iron 85 27 - 840 ug/dL   Iron Saturation 27 15 - 55 %   Ferritin 19 15 - 150 ng/mL        Assessment/Plan: 1. Encounter for general adult medical examination with abnormal findings (Primary) CPE performed, labs reviewed, due for mammogram  2. Essential hypertension Stable, continue amlodipine  as before - amLODipine  (NORVASC ) 5 MG tablet; Take 1 tablet (5 mg total) by mouth daily.  Dispense: 90 tablet; Refill: 1  3. Visit for screening mammogram - MM 3D SCREENING MAMMOGRAM BILATERAL BREAST; Future  4. Mixed hyperlipidemia -  rosuvastatin  (CRESTOR ) 5 MG tablet; Take 1 tablet (5 mg total) by mouth daily.  Dispense: 90 tablet; Refill: 3  5. Need for Tdap vaccination - Tdap (BOOSTRIX) 5-2.5-18.5 LF-MCG/0.5 injection; Inject 0.5 mLs into the muscle once for 1 dose.  Dispense: 0.5 mL; Refill: 0   General Counseling: Ameliyah verbalizes understanding of the findings of todays visit and agrees with plan of treatment. I have discussed any further diagnostic evaluation that may be needed or ordered today. We also reviewed her medications today. she has been encouraged to call the office with any questions or concerns that should arise related to todays visit.    Counseling:    Orders Placed This Encounter  Procedures   MM 3D SCREENING MAMMOGRAM BILATERAL BREAST    Meds ordered this encounter  Medications   amLODipine  (NORVASC ) 5 MG tablet    Sig: Take 1 tablet (5 mg total) by mouth daily.    Dispense:  90 tablet    Refill:  1    PT NEED REFILL PLEASE   rosuvastatin  (CRESTOR ) 5 MG tablet    Sig: Take 1 tablet (5 mg total) by mouth daily.    Dispense:  90 tablet    Refill:  3    PT IS REQUESTING REFILL   Tdap (BOOSTRIX) 5-2.5-18.5 LF-MCG/0.5 injection    Sig: Inject 0.5 mLs into the muscle once for 1 dose.    Dispense:  0.5 mL    Refill:  0    This patient was seen by Tinnie Pro, PA-C in collaboration with Dr. Sigrid Bathe as a part of collaborative care agreement.  Total time spent:35 Minutes  Time spent includes review of chart, medications, test  results, and follow up plan with the patient.     Sigrid CHRISTELLA Bathe, MD  Internal Medicine

## 2024-01-29 ENCOUNTER — Telehealth: Payer: Self-pay

## 2024-01-29 ENCOUNTER — Other Ambulatory Visit: Payer: Self-pay

## 2024-01-29 MED ORDER — NITROFURANTOIN MONOHYD MACRO 100 MG PO CAPS: 100.0000 mg | ORAL_CAPSULE | Freq: Two times a day (BID) | ORAL | 0 refills | Status: DC

## 2024-01-29 NOTE — Telephone Encounter (Signed)
 Sent Macrobid  for patient per Lauren.

## 2024-07-27 ENCOUNTER — Encounter: Payer: Self-pay | Admitting: Physician Assistant

## 2024-07-27 ENCOUNTER — Ambulatory Visit: Admitting: Physician Assistant

## 2024-07-27 VITALS — BP 138/90 | HR 89 | Temp 98.0°F | Resp 16 | Ht 65.0 in | Wt 165.0 lb

## 2024-07-27 DIAGNOSIS — I10 Essential (primary) hypertension: Secondary | ICD-10-CM | POA: Diagnosis not present

## 2024-07-27 DIAGNOSIS — F411 Generalized anxiety disorder: Secondary | ICD-10-CM | POA: Diagnosis not present

## 2024-07-27 DIAGNOSIS — N951 Menopausal and female climacteric states: Secondary | ICD-10-CM

## 2024-07-27 DIAGNOSIS — M722 Plantar fascial fibromatosis: Secondary | ICD-10-CM | POA: Diagnosis not present

## 2024-07-27 MED ORDER — FLUOXETINE HCL 10 MG PO CAPS: 10.0000 mg | ORAL_CAPSULE | Freq: Every day | ORAL | 0 refills | Status: AC

## 2024-07-27 MED ORDER — FLUOXETINE HCL 20 MG PO CAPS: 20.0000 mg | ORAL_CAPSULE | Freq: Every day | ORAL | 0 refills | Status: AC

## 2024-07-27 MED ORDER — VENLAFAXINE HCL ER 37.5 MG PO CP24: 37.5000 mg | ORAL_CAPSULE | Freq: Every day | ORAL | 2 refills | Status: AC

## 2024-07-27 NOTE — Progress Notes (Signed)
 [facility] [address] [place], [place] [address]  Internal MEDICINE  Office Visit Note  Patient Name: Stephanie Ferrell  [id number]  [id number]  Date of Service: 07/27/2024  Chief Complaint  Patient presents with   Follow-up   Medication Refill    Prozac     HPI  Pt is here for routine follow up -right foot plantar fasciitis more bothersome now -states her OBGYN retired and is looking for a new one, but asks us  to take over prozac  though admits she doesn't know that it is really working anymore. She has been on this a very long time -night sweats/hot flashes and sleep disturbances -BP 140/86 on repeat  Current Medication: Outpatient Encounter Medications as of 07/27/2024  Medication Sig   amLODipine  (NORVASC ) 5 MG tablet Take 1 tablet (5 mg total) by mouth daily.   FLUoxetine  (PROZAC ) 10 MG capsule Take 1 capsule (10 mg total) by mouth daily.   FLUoxetine  (PROZAC ) 20 MG capsule Take 1 capsule (20 mg total) by mouth daily.   fluticasone  (FLONASE ) 50 MCG/ACT nasal spray Place 2 sprays into both nostrils daily.   norethindrone  (MICRONOR ) 0.35 MG tablet Take 1 tablet (0.35 mg total) by mouth daily.   rosuvastatin  (CRESTOR ) 5 MG tablet Take 1 tablet (5 mg total) by mouth daily.   tolterodine  (DETROL  LA) 4 MG 24 hr capsule Take 1 capsule (4 mg total) by mouth daily.   venlafaxine  XR (EFFEXOR  XR) 37.5 MG 24 hr capsule Take 1 capsule (37.5 mg total) by mouth daily with breakfast.   [DISCONTINUED] FLUoxetine  (PROZAC ) 40 MG capsule TAKE 1 CAPSULE BY MOUTH EVERY DAY   [DISCONTINUED] nitrofurantoin , macrocrystal-monohydrate, (MACROBID ) 100 MG capsule Take 1 capsule (100 mg total) by mouth 2 (two) times daily.   No facility-administered encounter medications on file as of 07/27/2024.    Surgical History: Past Surgical History:  Procedure Laterality Date   CESAREAN SECTION     COLONOSCOPY WITH PROPOFOL  N/A 03/01/2021   Procedure: COLONOSCOPY WITH PROPOFOL ;  Surgeon: Janalyn Keene NOVAK, MD;  Location: [facility] ENDOSCOPY;  Service: Endoscopy;  Laterality: N/A;   XI ROBOTIC LAPAROSCOPIC ASSISTED APPENDECTOMY N/A 01/27/2022   Procedure: XI ROBOTIC LAPAROSCOPIC ASSISTED APPENDECTOMY;  Surgeon: Rodolph Romano, MD;  Location: [facility] ORS;  Service: General;  Laterality: N/A;    Medical History: Past Medical History:  Diagnosis Date   Hyperlipidemia    Hypertension     Family History: Family History  Problem Relation Age of Onset   Anxiety disorder Mother    Depression Mother    Cardiomyopathy Mother    Hypertension Father    Hypertrophic cardiomyopathy Brother    Diabetes Maternal Uncle    Colon cancer Maternal Grandfather    Cancer Paternal Grandfather    Breast cancer Neg Hx     Social History   Socioeconomic History   Marital status: Married    Spouse name: Not on file   Number of children: Not on file   Years of education: Not on file   Highest education level: Not on file  Occupational History   Not on file  Tobacco Use   Smoking status: Never   Smokeless tobacco: Never  Vaping Use   Vaping status: Never Used  Substance and Sexual Activity   Alcohol use: Yes    Comment: ocassionally    Drug use: No   Sexual activity: Yes    Birth control/protection: Pill  Other Topics Concern   Not on file  Social History Narrative  Not on file   Social Drivers of Health   Tobacco Use: Low Risk (07/27/2024)   Patient History    Smoking Tobacco Use: Never    Smokeless Tobacco Use: Never    Passive Exposure: Not on file  Financial Resource Strain: Not on file  Food Insecurity: Not on file  Transportation Needs: Not on file  Physical Activity: Not on file  Stress: Not on file  Social Connections: Not on file  Intimate Partner Violence: Not on file  Depression (PHQ2-9): Low Risk (07/27/2024)   Depression (PHQ2-9)    PHQ-2 Score: 0  Alcohol Screen: Low Risk (07/27/2024)   Alcohol Screen    Last Alcohol Screening Score (AUDIT): 2  Housing: Not on  file  Utilities: Not on file  Health Literacy: Not on file      Review of Systems  Constitutional:  Negative for chills and unexpected weight change.  HENT:  Negative for congestion, postnasal drip, rhinorrhea, sneezing and sore throat.   Eyes:  Negative for redness.  Respiratory:  Negative for cough, chest tightness and shortness of breath.   Cardiovascular:  Negative for chest pain and palpitations.  Gastrointestinal:  Negative for abdominal pain, constipation, diarrhea, nausea and vomiting.  Genitourinary:  Negative for dysuria and frequency.  Musculoskeletal:  Positive for arthralgias. Negative for back pain, joint swelling and neck pain.  Skin:  Negative for rash.  Neurological: Negative.  Negative for tremors and numbness.  Hematological:  Negative for adenopathy. Does not bruise/bleed easily.  Psychiatric/Behavioral:  Positive for sleep disturbance. Negative for suicidal ideas. Behavioral problem: Depression.    Vital Signs: BP (!) 138/90   Pulse 89   Temp 98 F (36.7 C)   Resp 16   Ht 5' 5 (1.651 m)   Wt 165 lb (74.8 kg)   SpO2 98%   BMI 27.46 kg/m    Physical Exam Vitals and nursing note reviewed.  Constitutional:      General: She is not in acute distress.    Appearance: Normal appearance. She is well-developed. She is not diaphoretic.  HENT:     Head: Normocephalic and atraumatic.     Right Ear: Tympanic membrane normal.     Left Ear: Tympanic membrane normal.  Eyes:     Pupils: Pupils are equal, round, and reactive to light.  Neck:     Thyroid: No thyromegaly.     Vascular: No JVD.     Trachea: No tracheal deviation.  Cardiovascular:     Rate and Rhythm: Normal rate and regular rhythm.     Heart sounds: Normal heart sounds. No murmur heard.    No friction rub. No gallop.  Pulmonary:     Effort: Pulmonary effort is normal.  Chest:  Breasts:    Right: Normal. No mass.     Left: Normal. No mass.  Musculoskeletal:        General: Normal range of  motion.  Skin:    General: Skin is warm and dry.  Neurological:     Mental Status: She is alert and oriented to person, place, and time.  Psychiatric:        Behavior: Behavior normal.        Assessment/Plan: 1. Essential hypertension (Primary) Borderline in office and will monitor  2. GAD (generalized anxiety disorder) Will taper prozac  down to 20mg  for 2 weeks then down to 10mg  while starting low dose effexor . Call if any issues with transition - FLUoxetine  (PROZAC ) 20 MG capsule; Take 1 capsule (20 mg  total) by mouth daily.  Dispense: 14 capsule; Refill: 0 - venlafaxine  XR (EFFEXOR  XR) 37.5 MG 24 hr capsule; Take 1 capsule (37.5 mg total) by mouth daily with breakfast.  Dispense: 30 capsule; Refill: 2 - FLUoxetine  (PROZAC ) 10 MG capsule; Take 1 capsule (10 mg total) by mouth daily.  Dispense: 14 capsule; Refill: 0  3. Vasomotor symptoms due to menopause Will switch to effexor  to help sleep/night sweats. May also consider gabapentin  4. Plantar fasciitis, right Will try frozen water bottle icing, anti-inflammatory, and well fitting shoes. If not improving may need podiatry   General Counseling: leylanie woodmansee understanding of the findings of todays visit and agrees with plan of treatment. I have discussed any further diagnostic evaluation that may be needed or ordered today. We also reviewed her medications today. she has been encouraged to call the office with any questions or concerns that should arise related to todays visit.    No orders of the defined types were placed in this encounter.   Meds ordered this encounter  Medications   FLUoxetine  (PROZAC ) 20 MG capsule    Sig: Take 1 capsule (20 mg total) by mouth daily.    Dispense:  14 capsule    Refill:  0    Pt tapering off prozac  and starting effexor    venlafaxine  XR (EFFEXOR  XR) 37.5 MG 24 hr capsule    Sig: Take 1 capsule (37.5 mg total) by mouth daily with breakfast.    Dispense:  30 capsule    Refill:   2   FLUoxetine  (PROZAC ) 10 MG capsule    Sig: Take 1 capsule (10 mg total) by mouth daily.    Dispense:  14 capsule    Refill:  0    This patient was seen by Tinnie Pro, PA-C in collaboration with Dr. Sigrid Bathe as a part of collaborative care agreement.   Total time spent:30 Minutes Time spent includes review of chart, medications, test results, and follow up plan with the patient.      Dr Fozia M Khan Internal medicine

## 2024-08-24 ENCOUNTER — Ambulatory Visit: Admitting: Physician Assistant

## 2024-09-03 ENCOUNTER — Ambulatory Visit: Admitting: Physician Assistant

## 2025-01-28 ENCOUNTER — Encounter: Admitting: Physician Assistant
